# Patient Record
Sex: Female | Born: 1965 | Race: White | Hispanic: No | Marital: Married | Smoking: Former smoker
Health system: Southern US, Community
[De-identification: ages and names within clinical notes are randomized; demographics above are authoritative.]

## PROBLEM LIST (undated history)

## (undated) DIAGNOSIS — I10 Essential (primary) hypertension: Secondary | ICD-10-CM

## (undated) DIAGNOSIS — T7840XA Allergy, unspecified, initial encounter: Secondary | ICD-10-CM

## (undated) DIAGNOSIS — E785 Hyperlipidemia, unspecified: Secondary | ICD-10-CM

## (undated) HISTORY — DX: Allergy, unspecified, initial encounter: T78.40XA

## (undated) HISTORY — DX: Essential (primary) hypertension: I10

## (undated) HISTORY — DX: Hyperlipidemia, unspecified: E78.5

---
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
## 1997-09-20 HISTORY — PX: TUBAL LIGATION: SHX77

## 1997-12-27 ENCOUNTER — Ambulatory Visit (HOSPITAL_COMMUNITY): Admission: RE | Admit: 1997-12-27 | Discharge: 1997-12-27 | Payer: Self-pay | Admitting: *Deleted

## 1998-09-20 HISTORY — PX: ABDOMINAL HYSTERECTOMY: SHX81

## 1998-09-23 ENCOUNTER — Observation Stay (HOSPITAL_COMMUNITY): Admission: RE | Admit: 1998-09-23 | Discharge: 1998-09-24 | Payer: Self-pay | Admitting: Obstetrics and Gynecology

## 2001-01-16 ENCOUNTER — Other Ambulatory Visit: Admission: RE | Admit: 2001-01-16 | Discharge: 2001-01-16 | Payer: Self-pay | Admitting: Obstetrics and Gynecology

## 2001-10-03 ENCOUNTER — Ambulatory Visit (HOSPITAL_BASED_OUTPATIENT_CLINIC_OR_DEPARTMENT_OTHER): Admission: RE | Admit: 2001-10-03 | Discharge: 2001-10-03 | Payer: Self-pay | Admitting: Plastic Surgery

## 2001-10-03 ENCOUNTER — Encounter (INDEPENDENT_AMBULATORY_CARE_PROVIDER_SITE_OTHER): Payer: Self-pay | Admitting: *Deleted

## 2002-09-20 HISTORY — PX: BRAIN SURGERY: SHX531

## 2002-11-14 ENCOUNTER — Other Ambulatory Visit: Admission: RE | Admit: 2002-11-14 | Discharge: 2002-11-14 | Payer: Self-pay | Admitting: Obstetrics and Gynecology

## 2004-02-03 ENCOUNTER — Encounter: Admission: RE | Admit: 2004-02-03 | Discharge: 2004-02-03 | Payer: Self-pay | Admitting: Family Medicine

## 2006-05-12 ENCOUNTER — Other Ambulatory Visit: Admission: RE | Admit: 2006-05-12 | Discharge: 2006-05-12 | Payer: Self-pay | Admitting: Obstetrics and Gynecology

## 2006-11-12 ENCOUNTER — Emergency Department (HOSPITAL_COMMUNITY): Admission: EM | Admit: 2006-11-12 | Discharge: 2006-11-12 | Payer: Self-pay | Admitting: Emergency Medicine

## 2008-05-29 ENCOUNTER — Other Ambulatory Visit: Admission: RE | Admit: 2008-05-29 | Discharge: 2008-05-29 | Payer: Self-pay | Admitting: Family Medicine

## 2009-02-05 ENCOUNTER — Ambulatory Visit (HOSPITAL_COMMUNITY): Admission: RE | Admit: 2009-02-05 | Discharge: 2009-02-05 | Payer: Self-pay | Admitting: Interventional Cardiology

## 2009-07-23 ENCOUNTER — Ambulatory Visit (HOSPITAL_COMMUNITY): Admission: RE | Admit: 2009-07-23 | Discharge: 2009-07-23 | Payer: Self-pay | Admitting: Interventional Cardiology

## 2009-07-23 ENCOUNTER — Encounter (INDEPENDENT_AMBULATORY_CARE_PROVIDER_SITE_OTHER): Payer: Self-pay | Admitting: Interventional Cardiology

## 2010-10-11 ENCOUNTER — Encounter: Payer: Self-pay | Admitting: Obstetrics and Gynecology

## 2011-02-05 NOTE — Op Note (Signed)
[place]. [facility]  Patient:    Natalie Suarez, Natalie Suarez Visit Number: [id number] MRN: [id number]          Service Type: DSU Location: [facility] Attending Physician:  Eloise Levels Dictated by:   Mary A. Contogiannis, M.D. Proc. Date: 10/03/01 Admit Date:  10/03/2001                             Operative Report  PREOPERATIVE DIAGNOSIS:  Bilateral macromastia.  POSTOPERATIVE DIAGNOSIS:  Bilateral macromastia.  PROCEDURE:  Bilateral reduction mammoplasty.  SURGEON:  Mary A. Contogiannis, M.D.  ASSISTANT:  Alethia Berthold, C.F.A.  ANESTHESIA:  General endotracheal.  ANESTHESIOLOGIST:  Janetta Hora. Gelene Mink, M.D.  ESTIMATED BLOOD LOSS:  200 cc.  FLUID REPLACEMENT:  2700 cc crystalloid.  URINE OUTPUT:  150 cc.  COMPLICATIONS:  None.  AMOUNT OF BREAST TISSUE REMOVED:  Right breast, 914 g; left breast, 936 g.  INDICATIONS FOR OVERNIGHT STAY:  Progressive pain control along with ambulation and monitoring of the nipples and breast flaps.  INDICATION FOR PROCEDURE:  The patient is a 45 year old Caucasian female who has bilateral macromastia, neckaches, headaches, backaches, shoulder strap groove marks and pain, along with intertriginous skin rashes, and presents for bilateral reduction mammoplasties at her request.  DESCRIPTION OF PROCEDURE:  The patient was marked in the preop holding area in the pattern of Wise for the future reduction mammoplasties.  She was then taken back to the operating room and placed on the OR table in supine position.  After adequate general endotracheal anesthesia was obtained, the patients breast bases were injected with 1% lidocaine with epinephrine.  The chest was prepped with Betadine and alcohol, then draped in sterile fashion. After adequate hemostasis and anesthesia had taken effect, the procedure was begun.  First the right breast reduction was performed.  The nipple was marked with a 42 mm nipple marker.  This was  then incised and the skin de-epithelialized around an inframammary crease in the inferior pedicle pattern.  Next the medial, superior, and lateral skin flaps were elevated down to the chest wall and slightly off the chest wall to provide better shaping of the breasts.  The excess fat and glandular tissue were removed from the inferior pedicle.  The nipple was then examined and found to be pink and viable.  The wound was irrigated with saline irrigation.  Meticulous hemostasis was obtained with the Bovie electrocautery.  The inferior pedicle was centralized using 3-0 Prolene suture.  A #10 JP flat, fully-fluted drain was then placed into the wound. The skin flaps were brought together at the inverted T junction with a 2-0 Prolene suture.  The incisions were stapled for temporary closure.  Attention was then turned to the left breast.  The nipple was marked with the 42 mm nipple marker.  This was then incised and the skin de-epithelialized around an inframammary crease in the inferior pedicle pattern.  Next the medial, superior, and lateral skin flaps were elevated down to the chest wall and slightly off the chest wall to for of the breasts.  The excess fat and glandular tissue were removed from the inferior pedicle.  The nipple was then examined and found to be pink and viable.  The wound was irrigated with saline irrigation.  Meticulous hemostasis was obtained with the Bovie electrocautery. The inferior pedicle was centralized using 3-0 Prolene suture.  A #10 JP flat, fully-fluted drain was then placed into the wound.  The skin flaps were brought together at the inverted T junction with a 2-0 Prolene suture.  The skin flaps were stapled for temporary closure.  The breasts were then compared and found to have good shape and symmetry.  The staples were then removed and the incisions closed using 3-0 Monocryl interrupted as well as running dermal sutures, followed by 4-0 Monocryl running  intracuticular stitch on the skin.  The patient was then placed in the upright position.  The future location of the nipples was marked with the 38 mm nipple marker.  She was then placed back into the recumbent position.  First the future nipple-areolar complex on the right breast mound was incised.  This tissue was excised full-thickness.  The nipple was then brought out into this aperture and sewn in place using 4-0 Monocryl interrupted dermal sutures followed by 5-0 Monocryl running intracuticular stitch on the skin.  In a likewise fashion, the area for the future nipple-areolar complex was incised on the left breast mound.  The tissue was excised full-thickness.  The nipple was then brought out into this aperture and sewn in place using 4-0 Monocryl interrupted dermal sutures followed by a 5-0 Monocryl running intracuticular stitch on the skin.  The drains were then sewn in place using 3-0 nylon suture.  The incisions were dressed with benzoin and Steri-Strips, additionally with bacitracin ointment and Adaptic on the nipples.  Four by fours were placed over this, and the patient was then placed into a light postoperative support bra.  There were no complications.  The patient tolerated the procedure well.  The final needle and sponge counts were reported to be correct at the end of the case.  She was then awakened from the general endotracheal anesthesia and taken to the recovery room in stable condition.  The patient will be admitted overnight to the [facility] for progressive pain control, ambulation, as well as monitoring of the nipples and breast flaps.  Discharge is planned for the morning.  Amount of breast tissue removed:  Right breast, 914 g; left breast, 936 g. Dictated by:   Mary A. Contogiannis, M.D. Attending Physician:  Eloise Levels DD:  10/03/01 TD:  10/03/01 Job: [phone] [id number]

## 2012-11-10 ENCOUNTER — Other Ambulatory Visit: Payer: Self-pay | Admitting: Family Medicine

## 2012-11-10 DIAGNOSIS — E559 Vitamin D deficiency, unspecified: Secondary | ICD-10-CM

## 2012-11-10 DIAGNOSIS — Z780 Asymptomatic menopausal state: Secondary | ICD-10-CM

## 2012-11-10 DIAGNOSIS — Z1231 Encounter for screening mammogram for malignant neoplasm of breast: Secondary | ICD-10-CM

## 2012-12-01 ENCOUNTER — Other Ambulatory Visit: Payer: Self-pay

## 2012-12-01 ENCOUNTER — Ambulatory Visit: Payer: Self-pay

## 2012-12-26 ENCOUNTER — Ambulatory Visit: Payer: Self-pay

## 2012-12-26 ENCOUNTER — Other Ambulatory Visit: Payer: Self-pay

## 2013-01-19 ENCOUNTER — Ambulatory Visit (INDEPENDENT_AMBULATORY_CARE_PROVIDER_SITE_OTHER): Payer: Commercial Managed Care - PPO | Admitting: General Surgery

## 2013-01-24 ENCOUNTER — Ambulatory Visit (INDEPENDENT_AMBULATORY_CARE_PROVIDER_SITE_OTHER): Payer: Commercial Managed Care - PPO | Admitting: General Surgery

## 2013-02-01 ENCOUNTER — Ambulatory Visit: Payer: Self-pay

## 2013-02-01 ENCOUNTER — Other Ambulatory Visit: Payer: Self-pay

## 2013-02-09 ENCOUNTER — Encounter (INDEPENDENT_AMBULATORY_CARE_PROVIDER_SITE_OTHER): Payer: Self-pay | Admitting: General Surgery

## 2013-02-09 ENCOUNTER — Ambulatory Visit (INDEPENDENT_AMBULATORY_CARE_PROVIDER_SITE_OTHER): Payer: Commercial Managed Care - PPO | Admitting: General Surgery

## 2013-02-09 NOTE — Progress Notes (Signed)
Subjective:   morbid obesity  Patient ID: Natalie Suarez, female   DOB: 08/12/1966, 47 y.o.   MRN: [id number]  HPI Natalie Suarez [id number] y.o.female presents for consideration for surgical treatment for morbid obesity. She works in a Research scientist (medical) at [employer].   she gives a history of progressive obesity since young adulthood, particularly since the birth of her children, despite multiple attempts at medical management. She has been able to lose up to almost 50 pounds at a time with diet and exercise programs but been experiences progressive weight regain. her weight has been affecting her in a number of ways including chronic joint pain and dyslipidemia. She has a strong family history of heart disease as well as diabetes and is very concerned about her long-term health going forward at her current weight.. she has been to our initial information seminar, researched surgical options thoroughly and is interested in lap band surgery due to the safety profile and lack of stapling or permanent rerouting of her intestine. She also feels that slow steady weight loss would fit her better. She has 2 coworkers on whom I operated and of very well with the lap band has gotten a lot of information from them as well as Nurse, mental health.  Past Medical History  Diagnosis Date  . Hyperlipidemia    Past Surgical History  Procedure Laterality Date  . Ear surgery  1980  . Cesarean section  1989  . Abdominal hysterectomy  2000    total  . Brain surgery  2004    reduction  . Tubal ligation  1999   Current Outpatient Prescriptions  Medication Sig Dispense Refill  . Vitamin D, Ergocalciferol, (DRISDOL) 50000 UNITS CAPS Take 50,000 Units by mouth 2 (two) times a week.       No current facility-administered medications for this visit.   Allergies  Allergen Reactions  . Crestor (Rosuvastatin)     Aching muscles  . Lipitor (Atorvastatin)     Aching muscles   History  Substance Use Topics  . Smoking  status: Former Smoker    Quit date: 02/10/1996  . Smokeless tobacco: Not on file  . Alcohol Use: Yes     Comment: rare     Review of Systems  Constitutional: Negative.   Respiratory: Negative.   Cardiovascular: Positive for palpitations. Negative for chest pain and leg swelling.  Gastrointestinal: Negative.        Mild occasional reflux  Genitourinary: Negative.   Musculoskeletal: Positive for myalgias, back pain and arthralgias.  Neurological: Negative.   Hematological: Negative.        Objective:   Physical Exam BP 138/86  Pulse 76  Temp(Src) 97.1 F (36.2 C) (Temporal)  Resp 16  Ht 5\' 2"  (1.575 m)  Wt 227 lb 9.6 oz (103.239 kg)  BMI 41.62 kg/m2  SpO2 97% General: Alert, morbidly obese Caucasian female, in no distress Skin: Warm and dry without rash or infection. HEENT: No palpable masses or thyromegaly. Sclera nonicteric. Pupils equal round and reactive. Oropharynx clear. Lymph nodes: No cervical, supraclavicular, or inguinal nodes palpable. Lungs: Breath sounds clear and equal without increased work of breathing Cardiovascular: Regular rate and rhythm without murmur. No JVD or edema. Peripheral pulses intact. Abdomen: Nondistended. Soft and nontender. No masses palpable. No organomegaly. No palpable hernias. Extremities: No edema or joint swelling or deformity. No chronic venous stasis changes. Neurologic: Alert and fully oriented. Gait normal.    Assessment:     Patient with progressive morbid obesity unresponsive  to multiple efforts at medical management who presents with a BMI of 41.6 and comorbidities of dyslipidemia and chronic joint pain. I believe there would be very significant medical benefit from surgical weight loss. After our discussion of surgical options currently available the patient has decided to proceed with LAP-BAND placement due to the reasons above. We have discussed the nature of morbid obesity and the risk of remaining obese. We discussed the  indications for the procedure, its nature, and expected recovery. The risks of the procedure were discussed in detail including anesthetic complications, bleeding, infection, visceral injury, dysphagia, and long-term risks of mechanical failure, slippage, erosion, esophageal dilatation and failure to lose weight. Rare risk of death was discussed. The patient was given a complete consent form and all questions were answered. We will proceed with preoperative workup including routine lab and x-rays, psychologic and nutrition evaluations, and upper GI series.  I will see the patient back following this evaluation.        Plan:     As above

## 2013-02-13 LAB — LIPID PANEL
HDL: 47 mg/dL (ref >39)
Triglycerides: 139 mg/dL (ref <150)

## 2013-02-13 LAB — CBC WITH DIFFERENTIAL/PLATELET
Eosinophils Absolute: 0.1 10*3/uL (ref 0.0–0.7)
Eosinophils Relative: 2 % (ref 0–5)
Hemoglobin: 13.7 g/dL (ref 12.0–15.0)
Lymphs Abs: 2.7 10*3/uL (ref 0.7–4.0)
MCH: 30.2 pg (ref 26.0–34.0)
MCV: 89.2 fL (ref 78.0–100.0)
Monocytes Absolute: 0.4 10*3/uL (ref 0.1–1.0)
Monocytes Relative: 7 % (ref 3–12)
RBC: 4.53 MIL/uL (ref 3.87–5.11)

## 2013-02-13 LAB — COMPREHENSIVE METABOLIC PANEL
CO2: 27 mEq/L (ref 19–32)
Creat: 0.56 mg/dL (ref 0.50–1.10)
Glucose, Bld: 92 mg/dL (ref 70–99)
Total Bilirubin: 0.7 mg/dL (ref 0.3–1.2)

## 2013-02-13 LAB — T4: T4, Total: 10.6 ug/dL (ref 5.0–12.5)

## 2013-02-13 LAB — TSH: TSH: 1.883 u[IU]/mL (ref 0.350–4.500)

## 2013-02-20 ENCOUNTER — Ambulatory Visit
Admission: RE | Admit: 2013-02-20 | Discharge: 2013-02-20 | Disposition: A | Discharge status: Home / Self Care | Payer: 59 | Source: Ambulatory Visit | Attending: Family Medicine | Admitting: Family Medicine

## 2013-02-20 DIAGNOSIS — E559 Vitamin D deficiency, unspecified: Secondary | ICD-10-CM

## 2013-02-20 DIAGNOSIS — Z780 Asymptomatic menopausal state: Secondary | ICD-10-CM

## 2013-02-20 DIAGNOSIS — Z1231 Encounter for screening mammogram for malignant neoplasm of breast: Secondary | ICD-10-CM

## 2013-02-21 ENCOUNTER — Other Ambulatory Visit: Payer: Self-pay

## 2013-02-21 ENCOUNTER — Ambulatory Visit: Payer: Self-pay

## 2013-03-12 ENCOUNTER — Other Ambulatory Visit (HOSPITAL_COMMUNITY): Payer: Self-pay

## 2013-03-12 ENCOUNTER — Ambulatory Visit (HOSPITAL_COMMUNITY): Payer: Self-pay

## 2013-03-14 ENCOUNTER — Ambulatory Visit: Payer: Self-pay | Admitting: *Deleted

## 2013-03-19 ENCOUNTER — Encounter: Payer: Self-pay | Admitting: *Deleted

## 2014-05-09 ENCOUNTER — Other Ambulatory Visit: Payer: Self-pay

## 2014-12-16 ENCOUNTER — Other Ambulatory Visit: Payer: Self-pay | Admitting: Family Medicine

## 2014-12-16 DIAGNOSIS — Z1231 Encounter for screening mammogram for malignant neoplasm of breast: Secondary | ICD-10-CM

## 2014-12-16 DIAGNOSIS — Z9889 Other specified postprocedural states: Secondary | ICD-10-CM

## 2014-12-17 ENCOUNTER — Ambulatory Visit
Admission: RE | Admit: 2014-12-17 | Discharge: 2014-12-17 | Disposition: A | Discharge status: Home / Self Care | Payer: 59 | Source: Ambulatory Visit | Attending: Family Medicine | Admitting: Family Medicine

## 2014-12-17 DIAGNOSIS — Z9889 Other specified postprocedural states: Secondary | ICD-10-CM

## 2014-12-17 DIAGNOSIS — Z1231 Encounter for screening mammogram for malignant neoplasm of breast: Secondary | ICD-10-CM

## 2015-02-22 ENCOUNTER — Encounter: Payer: Self-pay | Admitting: Dietician

## 2015-02-22 ENCOUNTER — Encounter: Payer: 59 | Attending: General Surgery | Admitting: Dietician

## 2015-02-22 DIAGNOSIS — Z713 Dietary counseling and surveillance: Secondary | ICD-10-CM | POA: Insufficient documentation

## 2015-02-22 DIAGNOSIS — Z6841 Body Mass Index (BMI) 40.0 and over, adult: Secondary | ICD-10-CM | POA: Insufficient documentation

## 2015-02-22 NOTE — Progress Notes (Signed)
  Pre-Op Assessment Visit:  Pre-Operative Sleeve Gastrectomy Surgery  Medical Nutrition Therapy:  Appt start time: 1300   End time:  1340.  Patient was seen on 02/22/2015 for Pre-Operative Nutrition Assessment. Assessment and letter of approval will [facility] coordinator once [facility] from [facility]Hoxworth [facility].   Preferred Learning Style:   No preference indicated   Learning Readiness:   Ready  Handouts given during visit include:  Pre-Op Goals Bariatric Surgery Protein Shakes   During the appointment today the following Pre-Op Goals were reviewed with the patient: Maintain or lose weight as instructed by your surgeon Make healthy food choices Begin to limit portion sizes Limited concentrated sugars and fried foods Keep fat/sugar in the single digits per serving on   food labels Practice CHEWING your food  (aim for 30 chews per bite or until applesauce consistency) Practice not drinking 15 minutes before, during, and 30 minutes after each meal/snack Avoid all carbonated beverages  Avoid/limit caffeinated beverages  Avoid all sugar-sweetened beverages Consume 3 meals per day; eat every 3-5 hours Make a list of non-food related activities Aim for 64-100 ounces of FLUID daily  Aim for at least 60-80 grams of PROTEIN daily Look for a liquid protein source that contain ?15 g protein and ?5 g carbohydrate  (ex: shakes, drinks, shots)  Demonstrated degree of understanding via:  Teach Back  Teaching Method Utilized:  Visual Auditory Hands on  Barriers to learning/adherence to lifestyle change: none  Patient to call the [facility] to enroll in Pre-Op and Post-Op Nutrition Education when surgery date is scheduled.

## 2015-02-22 NOTE — Patient Instructions (Signed)

## 2015-03-07 ENCOUNTER — Other Ambulatory Visit: Payer: Self-pay

## 2015-03-07 NOTE — Addendum Note (Signed)
Addended by: Glenna Fellows T on: 03/07/2015 06:32 PM   Modules accepted: Orders

## 2015-04-03 ENCOUNTER — Other Ambulatory Visit: Payer: Self-pay | Admitting: General Surgery

## 2015-04-16 ENCOUNTER — Ambulatory Visit (HOSPITAL_COMMUNITY): Payer: 59

## 2015-09-16 ENCOUNTER — Ambulatory Visit (INDEPENDENT_AMBULATORY_CARE_PROVIDER_SITE_OTHER): Payer: 59 | Admitting: Emergency Medicine

## 2015-09-16 VITALS — BP 142/88 | HR 89 | Temp 98.1°F | Resp 17 | Ht 62.0 in | Wt 234.0 lb

## 2015-09-16 DIAGNOSIS — J0140 Acute pansinusitis, unspecified: Secondary | ICD-10-CM

## 2015-09-16 DIAGNOSIS — J209 Acute bronchitis, unspecified: Secondary | ICD-10-CM

## 2015-09-16 MED ORDER — PSEUDOEPHEDRINE-GUAIFENESIN ER 60-600 MG PO TB12: 1.0000 | ORAL_TABLET | Freq: Two times a day (BID) | ORAL | Status: AC

## 2015-09-16 MED ORDER — HYDROCOD POLST-CPM POLST ER 10-8 MG/5ML PO SUER
5.0000 mL | Freq: Two times a day (BID) | ORAL | Status: DC
Start: 2015-09-16 — End: 2023-07-26

## 2015-09-16 MED ORDER — AMOXICILLIN-POT CLAVULANATE 875-125 MG PO TABS: 1.0000 | ORAL_TABLET | Freq: Two times a day (BID) | ORAL | Status: DC

## 2015-09-16 NOTE — Progress Notes (Signed)
Subjective:  Patient ID: Natalie Suarez, female    DOB: 1966-06-14  Age: 49 y.o. MRN: [id number]  CC: Headache; Nasal Congestion; and Ear Problem   HPI Loralye O Scholz presents   Patient has nasal congestion postnasal drainage and purulent nasal drainage. She has a cough productive purulent sputum. She has no wheezing or shortness of breath. She has no nausea or vomiting. No fever or chills. No stool change. She's had no improvement with over-the-counter medication  History Lutricia has a past medical history of Hyperlipidemia; Hypertension; and Allergy.   She has past surgical history that includes ear surgery (1980); Cesarean section (1989); Abdominal hysterectomy (2000); Brain surgery (2004); and Tubal ligation (1999).   Her  family history includes Cancer in her maternal uncle and maternal uncle; Diabetes in her mother and sister; Heart disease in her mother and sister; Hyperlipidemia in her mother and sister.  She   reports that she quit smoking about 19 years ago. She does not have any smokeless tobacco history on file. She reports that she drinks alcohol. She reports that she does not use illicit drugs.  Outpatient Prescriptions Prior to Visit  Medication Sig Dispense Refill  . Vitamin D, Ergocalciferol, (DRISDOL) 50000 UNITS CAPS Take 50,000 Units by mouth 2 (two) times a week. Reported on 09/16/2015     No facility-administered medications prior to visit.    Social History   Social History  . Marital Status: Married    Spouse Name: N/A  . Number of Children: N/A  . Years of Education: N/A   Social History Main Topics  . Smoking status: Former Smoker    Quit date: 02/10/1996  . Smokeless tobacco: None  . Alcohol Use: Yes     Comment: rare  . Drug Use: No  . Sexual Activity: Not Asked   Other Topics Concern  . None   Social History Narrative     Review of Systems  Constitutional: Positive for fatigue. Negative for fever, chills and appetite change.    HENT: Positive for congestion, ear pain, postnasal drip, rhinorrhea and sinus pressure. Negative for sore throat.   Eyes: Negative for pain and redness.  Respiratory: Positive for cough. Negative for shortness of breath and wheezing.   Cardiovascular: Negative for leg swelling.  Gastrointestinal: Negative for nausea, vomiting, abdominal pain, diarrhea, constipation and blood in stool.  Endocrine: Negative for polyuria.  Genitourinary: Negative for dysuria, urgency, frequency and flank pain.  Musculoskeletal: Negative for gait problem.  Skin: Negative for rash.  Neurological: Negative for weakness and headaches.  Psychiatric/Behavioral: Negative for confusion and decreased concentration. The patient is not nervous/anxious.     Objective:  BP 142/88 mmHg  Pulse 89  Temp(Src) 98.1 F (36.7 C) (Oral)  Resp 17  Ht  (1.575 m)  Wt 234 lb (106.142 kg)  BMI 42.79 kg/m2  SpO2 96%  Physical Exam  Constitutional: She is oriented to person, place, and time. She appears well-developed and well-nourished. No distress.  HENT:  Head: Normocephalic and atraumatic.  Right Ear: External ear normal.  Left Ear: External ear normal.  Nose: Nose normal.  Eyes: Conjunctivae and EOM are normal. Pupils are equal, round, and reactive to light. No scleral icterus.  Neck: Normal range of motion. Neck supple. No tracheal deviation present.  Cardiovascular: Normal rate, regular rhythm and normal heart sounds.   Pulmonary/Chest: Effort normal. No respiratory distress. She has no wheezes. She has no rales.  Abdominal: She exhibits no mass. There is no tenderness.  There is no rebound and no guarding.  Musculoskeletal: She exhibits no edema.  Lymphadenopathy:    She has no cervical adenopathy.  Neurological: She is alert and oriented to person, place, and time. Coordination normal.  Skin: Skin is warm and dry. No rash noted.  Psychiatric: She has a normal mood and affect. Her behavior is normal.       Assessment & Plan:   [name] was seen today for headache, nasal congestion and ear problem.  Diagnoses and all orders for this visit:  Acute bronchitis, unspecified organism  Acute pansinusitis, recurrence not specified  Other orders -     amoxicillin-clavulanate (AUGMENTIN) 875-125 MG tablet; Take 1 tablet by mouth 2 (two) times daily. -     pseudoephedrine-guaifenesin (MUCINEX D) 60-600 MG 12 hr tablet; Take 1 tablet by mouth every 12 (twelve) hours. -     chlorpheniramine-HYDROcodone (TUSSIONEX PENNKINETIC ER) 10-8 MG/5ML SUER; Take 5 mLs by mouth 2 (two) times daily.  I am having Ms. Cooperwood start on amoxicillin-clavulanate, pseudoephedrine-guaifenesin, and chlorpheniramine-HYDROcodone. I am also having her maintain her Vitamin D (Ergocalciferol).  Meds ordered this encounter  Medications  . amoxicillin-clavulanate (AUGMENTIN) 875-125 MG tablet    Sig: Take 1 tablet by mouth 2 (two) times daily.    Dispense:  20 tablet    Refill:  0  . pseudoephedrine-guaifenesin (MUCINEX D) 60-600 MG 12 hr tablet    Sig: Take 1 tablet by mouth every 12 (twelve) hours.    Dispense:  18 tablet    Refill:  0  . chlorpheniramine-HYDROcodone (TUSSIONEX PENNKINETIC ER) 10-8 MG/5ML SUER    Sig: Take 5 mLs by mouth 2 (two) times daily.    Dispense:  60 mL    Refill:  0    Appropriate red flag conditions were discussed with the patient as well as actions that should be taken.  Patient expressed his understanding.  Follow-up: Return if symptoms worsen or fail to improve.  Carmelina DaneAnderson, Rudi Knippenberg S, MD

## 2015-09-16 NOTE — Patient Instructions (Signed)

## 2015-12-23 DIAGNOSIS — R05 Cough: Secondary | ICD-10-CM | POA: Diagnosis not present

## 2016-02-04 DIAGNOSIS — H5203 Hypermetropia, bilateral: Secondary | ICD-10-CM | POA: Diagnosis not present

## 2016-02-04 DIAGNOSIS — H524 Presbyopia: Secondary | ICD-10-CM | POA: Diagnosis not present

## 2016-05-17 DIAGNOSIS — N898 Other specified noninflammatory disorders of vagina: Secondary | ICD-10-CM | POA: Diagnosis not present

## 2016-05-17 DIAGNOSIS — Z0000 Encounter for general adult medical examination without abnormal findings: Secondary | ICD-10-CM | POA: Diagnosis not present

## 2016-05-17 DIAGNOSIS — R8290 Unspecified abnormal findings in urine: Secondary | ICD-10-CM | POA: Diagnosis not present

## 2016-05-20 DIAGNOSIS — E559 Vitamin D deficiency, unspecified: Secondary | ICD-10-CM | POA: Diagnosis not present

## 2016-05-20 DIAGNOSIS — E785 Hyperlipidemia, unspecified: Secondary | ICD-10-CM | POA: Diagnosis not present

## 2016-05-20 DIAGNOSIS — R7301 Impaired fasting glucose: Secondary | ICD-10-CM | POA: Diagnosis not present

## 2016-05-25 MED FILL — VIT D2 1.25 MG (50,000 UNIT: 1.25 MG | 84 days supply | Qty: 24 | Fill #0

## 2016-05-31 MED FILL — CIPROFLOXACIN HCL 500 MG TA: 500 | 7 days supply | Qty: 14 | Fill #0

## 2016-05-31 MED FILL — FLUCONAZOLE 150 MG TABLET: 150 | 1 days supply | Qty: 1 | Fill #0

## 2016-10-19 DIAGNOSIS — R6889 Other general symptoms and signs: Secondary | ICD-10-CM | POA: Diagnosis not present

## 2016-10-19 DIAGNOSIS — A084 Viral intestinal infection, unspecified: Secondary | ICD-10-CM | POA: Diagnosis not present

## 2016-10-19 MED FILL — ONDANSETRON HCL 4 MG TABLET: 4 | 7 days supply | Qty: 15 | Fill #0

## 2017-03-11 DIAGNOSIS — M722 Plantar fascial fibromatosis: Secondary | ICD-10-CM | POA: Diagnosis not present

## 2017-03-21 MED FILL — OXYCODONE-ACETAMINOPHEN 5-3: 5-325 | 3 days supply | Qty: 12 | Fill #0

## 2017-03-21 MED FILL — IBUPROFEN 800 MG TAB: 800 | 7 days supply | Qty: 21 | Fill #0

## 2017-03-21 MED FILL — AMOXICILLIN 500 MG CAPSULE: 500 | 7 days supply | Qty: 21 | Fill #0

## 2017-04-04 ENCOUNTER — Ambulatory Visit: Payer: 59 | Admitting: Podiatry

## 2017-04-04 DIAGNOSIS — H2513 Age-related nuclear cataract, bilateral: Secondary | ICD-10-CM | POA: Diagnosis not present

## 2017-04-04 DIAGNOSIS — H524 Presbyopia: Secondary | ICD-10-CM | POA: Diagnosis not present

## 2017-04-04 DIAGNOSIS — H52201 Unspecified astigmatism, right eye: Secondary | ICD-10-CM | POA: Diagnosis not present

## 2017-04-04 DIAGNOSIS — H5201 Hypermetropia, right eye: Secondary | ICD-10-CM | POA: Diagnosis not present

## 2017-04-04 DIAGNOSIS — H25043 Posterior subcapsular polar age-related cataract, bilateral: Secondary | ICD-10-CM | POA: Diagnosis not present

## 2017-04-21 ENCOUNTER — Encounter: Payer: 59 | Admitting: Podiatry

## 2017-04-21 ENCOUNTER — Telehealth: Payer: Self-pay | Admitting: Podiatry

## 2017-04-21 NOTE — Telephone Encounter (Signed)
Left vm for pt to call to schedule appt

## 2017-04-22 NOTE — Progress Notes (Signed)
Patient cancelled

## 2017-05-16 ENCOUNTER — Ambulatory Visit: Payer: 59 | Admitting: Podiatry

## 2017-05-27 MED FILL — AZITHROMYCIN 250 MG TAB: 250 | 5 days supply | Qty: 6 | Fill #0

## 2017-09-05 DIAGNOSIS — H2513 Age-related nuclear cataract, bilateral: Secondary | ICD-10-CM | POA: Diagnosis not present

## 2017-09-05 DIAGNOSIS — H25043 Posterior subcapsular polar age-related cataract, bilateral: Secondary | ICD-10-CM | POA: Diagnosis not present

## 2017-09-05 MED FILL — OFLOXACIN 0.3% EYE DROPS: 0.3 | 50 days supply | Qty: 5 | Fill #0

## 2017-09-05 MED FILL — DUREZOL 0.05% EYE DROPS: 0.05 | 50 days supply | Qty: 5 | Fill #0

## 2017-09-14 DIAGNOSIS — H2511 Age-related nuclear cataract, right eye: Secondary | ICD-10-CM | POA: Diagnosis not present

## 2017-09-14 DIAGNOSIS — H25011 Cortical age-related cataract, right eye: Secondary | ICD-10-CM | POA: Diagnosis not present

## 2017-11-02 MED FILL — CHLORHEXIDINE 0.12% RINSE: 0.12 | 16 days supply | Qty: 473 | Fill #0

## 2018-03-16 MED FILL — SULFAMETHOXAZOLE-TMP DS TAB: 800-160 | 5 days supply | Qty: 10 | Fill #0

## 2018-04-11 MED FILL — NITROFURANTOIN MONO-MCR 100: 100 | 7 days supply | Qty: 14 | Fill #0

## 2018-04-11 MED FILL — VIT D2 1.25 MG (50,000 UNIT: 1.25 MG | 84 days supply | Qty: 12 | Fill #0

## 2018-04-11 MED FILL — PRAVASTATIN NA 40 MG TAB: 40 | 90 days supply | Qty: 90 | Fill #0

## 2018-05-01 MED FILL — CEFUROXIME AXETIL 250 MG TA: 250 | 7 days supply | Qty: 14 | Fill #0

## 2018-06-09 MED FILL — SULFAMETHOXAZOLE-TMP DS TAB: 800-160 | 7 days supply | Qty: 14 | Fill #0

## 2018-06-15 MED FILL — NITROFURANTOIN MONO-MCR 100: 100 | 7 days supply | Qty: 14 | Fill #0

## 2018-06-30 MED FILL — NITROFURANTOIN MONO-MCR 100: 100 | 10 days supply | Qty: 20 | Fill #0

## 2018-07-26 MED FILL — CEFUROXIME AXETIL 250 MG TA: 250 | 7 days supply | Qty: 14 | Fill #0

## 2018-07-26 MED FILL — PHENAZOPYRIDINE 200 MG TAB: 200 | 2 days supply | Qty: 6 | Fill #0

## 2018-09-07 MED FILL — CIPROFLOXACIN HCL 500 MG TA: 500 | 3 days supply | Qty: 6 | Fill #0

## 2018-09-08 MED FILL — PRAVASTATIN NA 40 MG TAB: 40 | 90 days supply | Qty: 90 | Fill #0

## 2018-10-02 MED FILL — MELOXICAM 7.5 MG TABLET: 7.5 | 10 days supply | Qty: 20 | Fill #0

## 2018-10-05 MED FILL — CIPROFLOXACIN HCL 500 MG TA: 500 | 30 days supply | Qty: 60 | Fill #0

## 2018-10-10 MED FILL — MELOXICAM 7.5 MG TABLET: 7.5 | 15 days supply | Qty: 30 | Fill #0

## 2018-12-02 MED FILL — predniSONE 10 MG TABS: 10 | 12 days supply | Qty: 21 | Fill #0

## 2018-12-20 MED FILL — metFORMIN HCL ER 500 MG TB2: 500 | 30 days supply | Qty: 60 | Fill #0

## 2019-01-19 MED FILL — ADVAIR 250/50 DISKUS: 250-50 | 30 days supply | Qty: 60 | Fill #0

## 2019-01-19 MED FILL — PRAVASTATIN NA 40 MG TAB: 40 | 90 days supply | Qty: 90 | Fill #0

## 2019-01-19 MED FILL — MONTELUKAST SOD 10 MG TAB: 10 | 30 days supply | Qty: 30 | Fill #0

## 2019-01-31 MED FILL — metFORMIN HCL ER 500 MG TB2: 500 | 30 days supply | Qty: 60 | Fill #1

## 2019-03-02 MED FILL — metFORMIN HCL ER 500 MG TB2: 500 | 45 days supply | Qty: 180 | Fill #0

## 2019-03-29 MED FILL — NITROFURANTOIN MONO-MCR 100: 100 | 7 days supply | Qty: 14 | Fill #0

## 2019-05-02 MED FILL — PRAVASTATIN NA 40 MG TAB: 40 | 90 days supply | Qty: 90 | Fill #0

## 2019-05-12 MED FILL — metFORMIN HCL ER 500 MG TB2: 500 | 90 days supply | Qty: 360 | Fill #0

## 2019-06-13 MED FILL — NITROFURANTOIN MONO-MCR 100: 100 | 7 days supply | Qty: 14 | Fill #0

## 2019-06-18 MED FILL — PRAVASTATIN NA 40 MG TAB: 40 | 90 days supply | Qty: 90 | Fill #0

## 2019-07-02 MED FILL — NITROFURANTOIN MONO-MCR 100: 100 | 30 days supply | Qty: 30 | Fill #0

## 2019-07-04 MED FILL — SULFAMETHOXAZOLE-TMP DS TAB: 800-160 | 14 days supply | Qty: 28 | Fill #0

## 2019-07-23 ENCOUNTER — Encounter (HOSPITAL_COMMUNITY): Payer: Self-pay | Admitting: Emergency Medicine

## 2019-07-23 ENCOUNTER — Other Ambulatory Visit: Payer: Self-pay

## 2019-07-23 ENCOUNTER — Emergency Department (HOSPITAL_COMMUNITY): Payer: No Typology Code available for payment source

## 2019-07-23 ENCOUNTER — Emergency Department (HOSPITAL_COMMUNITY)
Admission: EM | Admit: 2019-07-23 | Discharge: 2019-07-23 | Disposition: A | Discharge status: Home / Self Care | Payer: No Typology Code available for payment source | Attending: Emergency Medicine | Admitting: Emergency Medicine

## 2019-07-23 DIAGNOSIS — Z79899 Other long term (current) drug therapy: Secondary | ICD-10-CM | POA: Insufficient documentation

## 2019-07-23 DIAGNOSIS — I10 Essential (primary) hypertension: Secondary | ICD-10-CM | POA: Insufficient documentation

## 2019-07-23 DIAGNOSIS — R002 Palpitations: Secondary | ICD-10-CM | POA: Diagnosis not present

## 2019-07-23 DIAGNOSIS — Z87891 Personal history of nicotine dependence: Secondary | ICD-10-CM | POA: Insufficient documentation

## 2019-07-23 DIAGNOSIS — R000 Tachycardia, unspecified: Secondary | ICD-10-CM | POA: Diagnosis not present

## 2019-07-23 LAB — PHOSPHORUS: Phosphorus: 3.6 mg/dL (ref 2.5–4.6)

## 2019-07-23 LAB — HEPATIC FUNCTION PANEL
ALT: 39 U/L (ref 0–44)
AST: 31 U/L (ref 15–41)
Albumin: 4.3 g/dL (ref 3.5–5.0)
Alkaline Phosphatase: 56 U/L (ref 38–126)
Bilirubin, Direct: 0.1 mg/dL (ref 0.0–0.2)
Indirect Bilirubin: 0.8 mg/dL (ref 0.3–0.9)
Total Bilirubin: 0.9 mg/dL (ref 0.3–1.2)
Total Protein: 7.3 g/dL (ref 6.5–8.1)

## 2019-07-23 LAB — BASIC METABOLIC PANEL
Anion gap: 13 (ref 5–15)
BUN: 14 mg/dL (ref 6–20)
CO2: 23 mmol/L (ref 22–32)
Calcium: 9.4 mg/dL (ref 8.9–10.3)
Chloride: 103 mmol/L (ref 98–111)
Creatinine, Ser: 0.94 mg/dL (ref 0.44–1.00)
GFR calc Af Amer: >60 mL/min (ref >60)
GFR calc non Af Amer: >60 mL/min (ref >60)
Glucose, Bld: 157 mg/dL — ABNORMAL HIGH (ref 70–99)
Potassium: 4.5 mmol/L (ref 3.5–5.1)
Sodium: 139 mmol/L (ref 135–145)

## 2019-07-23 LAB — I-STAT BETA HCG BLOOD, ED (MC, WL, AP ONLY): I-stat hCG, quantitative: <5 m[IU]/mL (ref <5)

## 2019-07-23 LAB — TROPONIN I (HIGH SENSITIVITY)
Troponin I (High Sensitivity): 6 ng/L (ref <18)
Troponin I (High Sensitivity): 6 ng/L (ref <18)

## 2019-07-23 LAB — CBC
HCT: 45.8 % (ref 36.0–46.0)
Hemoglobin: 14.9 g/dL (ref 12.0–15.0)
MCH: 30.9 pg (ref 26.0–34.0)
MCHC: 32.5 g/dL (ref 30.0–36.0)
MCV: 95 fL (ref 80.0–100.0)
Platelets: 248 10*3/uL (ref 150–400)
RBC: 4.82 MIL/uL (ref 3.87–5.11)
RDW: 11.7 % (ref 11.5–15.5)
WBC: 7.6 10*3/uL (ref 4.0–10.5)
nRBC: 0 % (ref 0.0–0.2)

## 2019-07-23 LAB — T4, FREE: Free T4: 0.86 ng/dL (ref 0.61–1.12)

## 2019-07-23 LAB — MAGNESIUM: Magnesium: 1.9 mg/dL (ref 1.7–2.4)

## 2019-07-23 LAB — TSH: TSH: 2.35 u[IU]/mL (ref 0.350–4.500)

## 2019-07-23 LAB — D-DIMER, QUANTITATIVE: D-Dimer, Quant: <0.27 ug/mL-FEU (ref 0.00–0.50)

## 2019-07-23 MED ORDER — CARVEDILOL 12.5 MG PO TABS
6.2500 mg | ORAL_TABLET | Freq: Two times a day (BID) | ORAL | Status: DC
  Administered 2019-07-23: 16:00:00 6.25 mg via ORAL
  Filled 2019-07-23: qty 1

## 2019-07-23 MED ORDER — SODIUM CHLORIDE 0.9% FLUSH: 3.0000 mL | Freq: Once | INTRAVENOUS | Status: DC

## 2019-07-23 MED ORDER — METOPROLOL TARTRATE 5 MG/5ML IV SOLN
2.5000 mg | Freq: Once | INTRAVENOUS | Status: AC
  Administered 2019-07-23: 2.5 mg via INTRAVENOUS
  Filled 2019-07-23: qty 5

## 2019-07-23 MED ORDER — CARVEDILOL 6.25 MG PO TABS: 6.2500 mg | ORAL_TABLET | Freq: Two times a day (BID) | ORAL | 1 refills | Status: DC

## 2019-07-23 MED FILL — CARVEDILOL 6.25 MG TABLET: 6.25 | 30 days supply | Qty: 60 | Fill #0

## 2019-07-23 NOTE — Discharge Instructions (Signed)
1.  Start taking carvedilol 6.25 mg twice daily.  Monitor your heart rate and blood pressure and keep a journal. 2.  Return to the emergency department if you are developing chest pain, lightheadedness, feel he will pass out or short of breath or other concerning symptoms. 3.  Call your doctor tomorrow to schedule a follow-up appointment as soon as possible.  You will need evaluation for further testing such as stress test and ambulatory heart monitoring.

## 2019-07-23 NOTE — ED Notes (Signed)
Patient verbalizes understanding of discharge instructions. Opportunity for questioning and answers were provided. Armband removed by staff, pt discharged from ED ambulatory to home.  

## 2019-07-23 NOTE — ED Provider Notes (Addendum)
[facility] EMERGENCY DEPARTMENT Provider Note   CSN: [id number] Arrival date & time: 07/23/19  1042     History   Chief Complaint Chief Complaint  Patient presents with  . Hypertension  . Palpitations    HPI Natalie Suarez is a 53 y.o. female.     HPI Patient reports that she has had intermittent episodes of heart racing and occasionally getting short of breath and diaphoretic with symptoms.  Episodes were not prolonged.  This has been occurring over about the past week.  She reports last week she had an episode where her heart seemed to be beating quite fast.  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  She reports that day her heart was racing and she felt short of breath and fatigue to the point that she had to stop to rest a couple of times.  This is atypical.  Symptoms recurred today with more prolonged episode of heart racing and patient becoming diaphoretic.  She denies she is experiencing actual chest pain with these episodes.  She does sometimes feel extremely fatigued afterwards and shortness of breath can be a problem.  Patient denies she has been sick.  She has not had fever chills or productive cough.  Non-smoker.  Patient denies she has ever been told she should take medication for hypertension.  She reports last week her blood pressures were somewhat elevated but she was not told to start medicine.  Family history is for a mother who died in her early 75s from heart attack. Sister died in mid 35s from CVA.    Past Medical History:  Diagnosis Date  . Allergy   . Hyperlipidemia   . Hypertension     Patient Active Problem List   Diagnosis Date Noted  . Morbid obesity (HCC) 02/09/2013    Past Surgical History:  Procedure Laterality Date  . ABDOMINAL HYSTERECTOMY  2000   total  . BRAIN SURGERY  2004   reduction  . CESAREAN SECTION  1989  . ear surgery  1980  . TUBAL LIGATION  1999     OB History   No  obstetric history on file.      Home Medications    Prior to Admission medications   Medication Sig Start Date End Date Taking? Authorizing Provider  cefUROXime (CEFTIN) 250 MG tablet Take 250 mg by mouth 2 (two) times daily.   Yes [provider]  cholecalciferol (VITAMIN D3) 25 MCG (1000 UT) tablet Take 1,000 Units by mouth daily.   Yes [provider]  metFORMIN (GLUCOPHAGE-XR) 500 MG 24 hr tablet Take 1,000 mg by mouth 2 (two) times daily. 05/12/19  Yes [provider]  pravastatin (PRAVACHOL) 40 MG tablet Take 40 mg by mouth daily. 05/02/19  Yes [provider]  amoxicillin-clavulanate (AUGMENTIN) 875-125 MG tablet Take 1 tablet by mouth 2 (two) times daily. Patient not taking: Reported on 07/23/2019 09/16/15   Carmelina Dane, MD  carvedilol (COREG) 6.25 MG tablet Take 1 tablet (6.25 mg total) by mouth 2 (two) times daily with a meal. 07/23/19   Arby Barrette, MD  chlorpheniramine-HYDROcodone (TUSSIONEX PENNKINETIC ER) 10-8 MG/5ML SUER Take 5 mLs by mouth 2 (two) times daily. Patient not taking: Reported on 07/23/2019 09/16/15   Carmelina Dane, MD    Family History Family History  Problem Relation Age of Onset  . Heart disease Mother   . Diabetes Mother   . Hyperlipidemia Mother   .  Heart disease Sister   . Hyperlipidemia Sister   . Diabetes Sister   . Cancer Maternal Uncle        lung  . Cancer Maternal Uncle        liver,lymph nodes    Social History Social History   Tobacco Use  . Smoking status: Former Smoker    Quit date: 02/10/1996    Years since quitting: 23.4  . Smokeless tobacco: Never Used  Substance Use Topics  . Alcohol use: Yes    Comment: rare  . Drug use: No     Allergies   Crestor [rosuvastatin] and Lipitor [atorvastatin]   Review of Systems Review of Systems 10 Systems reviewed and are negative for acute change except as noted in the HPI.   Physical Exam Updated Vital Signs BP 124/80   Pulse  82   Temp 98.6 F (37 C) (Oral)   Resp (!) 21   Ht 5\' 2"  (1.575 m)   Wt 99.8 kg   SpO2 95%   BMI 40.24 kg/m   Physical Exam Constitutional:      Comments: Alert and appropriate.  No respiratory distress.  Nontoxic.  Monitor shows sinus rhythm high 90s to 100.  HENT:     Head: Normocephalic and atraumatic.     Mouth/Throat:     Pharynx: Oropharynx is clear.  Eyes:     Extraocular Movements: Extraocular movements intact.     Conjunctiva/sclera: Conjunctivae normal.  Neck:     Musculoskeletal: Normal range of motion and neck supple.     Comments: No thyromegaly. Cardiovascular:     Comments: Heart is regular.  Borderline tachycardia.  No rub murmur gallop. Pulmonary:     Effort: Pulmonary effort is normal.     Breath sounds: Normal breath sounds.  Abdominal:     General: Abdomen is flat. There is no distension.     Tenderness: There is no abdominal tenderness. There is no guarding.  Musculoskeletal: Normal range of motion.        General: No swelling or tenderness.     Right lower leg: No edema.     Left lower leg: No edema.  Skin:    General: Skin is warm and dry.  Neurological:     General: No focal deficit present.     Mental Status: She is oriented to person, place, and time.     Coordination: Coordination normal.  Psychiatric:        Mood and Affect: Mood normal.      ED Treatments / Results  Labs (all labs ordered are listed, but only abnormal results are displayed) Labs Reviewed  BASIC METABOLIC PANEL - Abnormal; Notable for the following components:      Result Value   Glucose, Bld 157 (*)    All other components within normal limits  CBC  T4, FREE  MAGNESIUM  PHOSPHORUS  HEPATIC FUNCTION PANEL  D-DIMER, QUANTITATIVE (NOT AT [facility])  TSH  I-STAT BETA HCG BLOOD, ED ([facility], [facility], [facility] ONLY)  TROPONIN I (HIGH SENSITIVITY)  TROPONIN I (HIGH SENSITIVITY)    EKG EKG Interpretation  Date/Time:  Monday July 23 2019 11:10:15 EST Ventricular Rate:  109 PR  Interval:  142 QRS Duration: 78 QT Interval:  334 QTC Calculation: 449 R Axis:   75 Text Interpretation: Sinus tachycardia Right atrial enlargement Borderline ECG agree. no old comparison Confirmed by Charlesetta Shanks [phone]) on 07/23/2019 1:16:31 PM   Radiology Dg Chest 2 View  Result Date: 07/23/2019 CLINICAL DATA:  Chest pain with shortness of breath and cardiac palpitations EXAM: CHEST - 2 VIEW COMPARISON:  Chest CT Feb 03, 2004 FINDINGS: Lungs are clear. Heart size and pulmonary vascularity are normal. No adenopathy. No bone lesions. No pneumothorax. IMPRESSION: No edema or consolidation. Electronically Signed   By: Bretta BangWilliam  Woodruff III M.D.   On: 07/23/2019 11:36    Procedures Procedures (including critical care time)  Medications Ordered in ED Medications  sodium chloride flush (NS) 0.9 % injection 3 mL (3 mLs Intravenous Not Given 07/23/19 1123)  carvedilol (COREG) tablet 6.25 mg (has no administration in time range)  metoprolol tartrate (LOPRESSOR) injection 2.5 mg (2.5 mg Intravenous Given 07/23/19 1413)     Initial Impression / Assessment and Plan / ED Course  I have reviewed the triage vital signs and the nursing notes.  Pertinent labs & imaging results that were available during my care of the patient were reviewed by me and considered in my medical decision making (see chart for details).       Patient has been getting episodes of palpitations with the quality of heart racing but not skipping or missing beats.  At times this is limiting her ability for exertion due to dyspnea and fatigue.  Symptoms have been self-limited.  She has had a couple episodes of more prolonged symptoms.  That includes today with associated diaphoresis.  Patient has significant family history for early vascular disease.  First troponin is negative.  Will add thyroid studies.  Patient is borderline hypertensive with heart rate sinus rhythm but in the mid 90s to 100.  Will give low-dose Lopressor  empirically.  Patient has responded well to Lopressor.  Blood pressures are mid 120s over 80s.  Heart rate is in the 80s in sinus rhythm.  Thyroid studies, D-dimer are normal.  At this time, I do feel patient is stable for discharge initiating low-dose carvedilol and close follow-up with her PCP and cardiology.  Patient is counseled to keep a journal of her blood pressures and heart rates.  Return precautions reviewed.  Final Clinical Impressions(s) / ED Diagnoses   Final diagnoses:  Palpitations  Regular sinus tachycardia    ED Discharge Orders         Ordered    carvedilol (COREG) 6.25 MG tablet  2 times daily with meals     07/23/19 1611           Arby BarrettePfeiffer, Luva Metzger, MD 07/23/19 1350    Arby BarrettePfeiffer, Maddi Collar, MD 07/23/19 1615

## 2019-07-23 NOTE — ED Triage Notes (Signed)
C/o shortness of breath and palpitations started am while at work- "was feeling a little anxious, but I don't have panic attacks" also states that she had an episode of same last Thursday.

## 2019-07-31 MED FILL — NITROFURANTOIN MONO-MCR 100: 100 | 30 days supply | Qty: 30 | Fill #1

## 2019-08-03 MED FILL — LISINOPRIL 10 MG TABS: 10 | 30 days supply | Qty: 30 | Fill #0

## 2019-08-03 MED FILL — FREESTYLE LITE METER: 30 days supply | Qty: 1 | Fill #0

## 2019-08-03 MED FILL — FREESTYLE LANCETS: 90 days supply | Qty: 100 | Fill #0

## 2019-08-03 MED FILL — FREESTYLE LITE TEST STRIP: 90 days supply | Qty: 100 | Fill #0

## 2019-08-15 MED FILL — FREESTYLE LANCETS: 90 days supply | Qty: 100 | Fill #0

## 2019-08-15 MED FILL — FREESTYLE LITE METER: 30 days supply | Qty: 1 | Fill #0

## 2019-08-15 MED FILL — FREESTYLE LITE TEST STRIP: 90 days supply | Qty: 100 | Fill #0

## 2019-08-24 MED FILL — PEG-3350 SOLUTION: 420 | 1 days supply | Qty: 4000 | Fill #0

## 2019-08-28 MED FILL — CARVEDILOL 6.25 MG TABLET: 6.25 | 30 days supply | Qty: 60 | Fill #1

## 2019-09-03 MED FILL — METFORMIN HCL ER 500 MG TB2: 500 | 45 days supply | Qty: 180 | Fill #0

## 2019-09-17 MED FILL — NITROFURANTOIN MONO-MCR 100: 100 | 30 days supply | Qty: 30 | Fill #2

## 2019-09-27 MED FILL — predniSONE 10 MG TABS: 10 | 12 days supply | Qty: 21 | Fill #0

## 2019-10-09 MED FILL — CARVEDILOL 6.25 MG TABLET: 6.25 | 90 days supply | Qty: 180 | Fill #0

## 2019-10-09 MED FILL — PRAVASTATIN NA 40 MG TAB: 40 | 90 days supply | Qty: 90 | Fill #0

## 2019-10-19 MED FILL — NITROFURANTOIN MONO-MCR 100: 100 | 30 days supply | Qty: 30 | Fill #3

## 2019-10-22 MED FILL — METFORMIN HCL ER 500 MG TB2: 500 | 30 days supply | Qty: 120 | Fill #0

## 2019-11-20 MED FILL — DICLOFENAC SOD EC 75 MG TAB: 75 | 15 days supply | Qty: 30 | Fill #0

## 2019-11-28 MED FILL — NITROFURANTOIN MONO-MCR 100: 100 | 30 days supply | Qty: 30 | Fill #4

## 2019-11-28 MED FILL — METFORMIN HCL ER 500 MG TB2: 500 | 15 days supply | Qty: 60 | Fill #1

## 2019-12-17 MED FILL — METFORMIN HCL ER 500 MG TB2: 500 | 30 days supply | Qty: 120 | Fill #0

## 2020-01-01 MED FILL — FREESTYLE LITE TEST STRIP: 90 days supply | Qty: 100 | Fill #1

## 2020-01-01 MED FILL — NITROFURANTOIN MONO-MCR 100: 100 | 30 days supply | Qty: 30 | Fill #5

## 2020-01-17 MED FILL — PRAVASTATIN NA 40 MG TAB: 40 | 90 days supply | Qty: 90 | Fill #1

## 2020-01-21 MED FILL — METFORMIN HCL ER 500 MG TB2: 500 | 30 days supply | Qty: 120 | Fill #0

## 2020-01-21 MED FILL — CARVEDILOL 6.25 MG TABLET: 6.25 | 90 days supply | Qty: 180 | Fill #1

## 2020-02-04 MED FILL — LISINOPRIL 20 MG TABLET: 20 | 90 days supply | Qty: 90 | Fill #0

## 2020-02-14 ENCOUNTER — Other Ambulatory Visit: Payer: Self-pay

## 2020-02-14 ENCOUNTER — Ambulatory Visit: Payer: No Typology Code available for payment source | Attending: Physician Assistant

## 2020-02-14 DIAGNOSIS — M25551 Pain in right hip: Secondary | ICD-10-CM | POA: Diagnosis present

## 2020-02-14 DIAGNOSIS — G8929 Other chronic pain: Secondary | ICD-10-CM | POA: Insufficient documentation

## 2020-02-14 DIAGNOSIS — M25552 Pain in left hip: Secondary | ICD-10-CM | POA: Insufficient documentation

## 2020-02-14 DIAGNOSIS — M545 Low back pain: Secondary | ICD-10-CM | POA: Insufficient documentation

## 2020-02-18 NOTE — Therapy (Addendum)
[place], [place], [address] Phone: [phone]   Fax:  [phone]  Physical Therapy Evaluation/DCSummary  Patient Details  Name: Natalie Suarez MRN: [id number] Date of Birth: December 26, 1965 Referring Provider (PT): Jenny Reichmann, [place]   Encounter Date: 02/14/2020  PT End of Session - 02/18/20 0929    Visit Number  1    Number of Visits  7    Date for PT Re-Evaluation  04/07/20    Authorization Type  [facility]    Progress Note Due on Visit  7    PT Start Time  1621    PT Stop Time  1709    PT Time Calculation (min)  48 min    Activity Tolerance  Patient tolerated treatment well    Behavior During Therapy  [facility] for tasks assessed/performed       Past Medical History:  Diagnosis Date  . Allergy   . Hyperlipidemia   . Hypertension     Past Surgical History:  Procedure Laterality Date  . ABDOMINAL HYSTERECTOMY  2000   total  . BRAIN SURGERY  2004   reduction  . CESAREAN SECTION  1989  . ear surgery  1980  . TUBAL LIGATION  1999    There were no vitals filed for this visit.   Subjective Assessment - 02/18/20 0811    Subjective  Pt reports a 4 month Hx of low back pain which varies from day to day with a pain range of 2-10/10. today she rates her pain a 2/10, while last Sunday it was a 7/10 after lying down. She reports getting up and walking was helpful. With her job, which involves lifting, bending, reaching, pulling and twisting her pain level will vary. Pt states she has been told she has a R pinched nerve, L sciatica, and scoliosis.    How long can you sit comfortably?  1 hour, varies    How long can you stand comfortably?  1 hour, varies    How long can you walk comfortably?  1 hour, varies    Diagnostic tests  Not available    Patient Stated Goals  To have les pain and to tolerate work better.    Currently in Pain?  Yes    Pain Score  2     Pain Location  Back    Pain  Orientation  Posterior;Lower;Right   R flank   Pain Descriptors / Indicators  Tingling;Burning    Pain Type  Chronic pain    Pain Radiating Towards  R flank    Pain Onset  More than a month ago    Pain Frequency  Constant    Aggravating Factors   Not consistent    Pain Relieving Factors  Not consistent    Effect of Pain on Daily Activities  pt states when she is hurting at work, she pushes through the pain.         [facility] PT Assessment - 02/18/20 0001      Assessment   Medical Diagnosis  Pain in left hip, Other chronic pain, Lumbago with sciatica, left side, Pain in right hip    Referring Provider (PT)  Cheryle Horsfall, Marva Panda, PA-C    Onset Date/Surgical Date  --   4 months ago   Hand Dominance  Right    Prior Therapy  None      Precautions   Precautions  None      Restrictions   Weight  Bearing Restrictions  No      Balance Screen   Has the patient fallen in the past 6 months  No    Has the patient had a decrease in activity level because of a fear of falling?   No    Is the patient reluctant to leave their home because of a fear of falling?   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, bending, pulling, twisting      Cognition   Overall Cognitive Status  Within Functional Limits for tasks assessed      Observation/Other Assessments   Focus on Therapeutic Outcomes (FOTO)   NA      Sensation   Light Touch  Appears Intact      Coordination   Gross Motor Movements are Fluid and Coordinated  Yes      Posture/Postural Control   Posture/Postural Control  Postural  limitations    Postural Limitations  Rounded Shoulders;Increased lumbar lordosis      ROM / Strength   AROM / PROM / Strength  AROM;Strength      AROM   AROM Assessment Site  Lumbar    Lumbar Flexion  WNLs   Lodosis not completely reversed with foward flexion   Lumbar Extension  WNLS    Lumbar - Right Side Bend  WNLS    Lumbar - Left Side Bend  WNLs    Lumbar - Right Rotation  WNLs    Lumbar - Left Rotation  WNLs      Strength   Overall Strength Comments  LE myotomes are intact      Palpation   Palpation comment  TTP to the R ASIS and anterior hip mucsle attachments      Special Tests    Special Tests  Hip Special Tests    Hip Special Tests   Saralyn Pilar (FABER) Test;Hip Scouring      Saralyn Pilar (FABER) Test   Findings  Negative    Side  Right;Left      Hip Scouring   Findings  Negative    Side  Right;Left      Transfers   Transfers  Sit to Stand;Stand to Sit    Sit to Stand  7: Independent      Ambulation/Gait   Ambulation/Gait  Yes    Ambulation/Gait Assistance  7: Independent                  Objective measurements completed on examination: See above findings.              PT Education - 02/18/20 0924    Education Details  Eval finding, POC, and HEP for directional extension preference: prone lying, prone on elbows, prone press ups and standing extension.    Person(s) Educated  Patient    Methods  Explanation;Demonstration;Tactile cues;Verbal cues;Handout    Comprehension  Verbalized understanding;Returned demonstration;Verbal cues required;Tactile cues required;Need further instruction  PT Short Term Goals - 02/18/20 0950      PT SHORT TERM GOAL #1   Title  Pt will be ind in a HEP    Baseline  HEP started c eval today    Time  3    Period  Weeks    Status  New    Target Date  03/10/20      PT SHORT TERM GOAL #2   Title  Pt will return demonstration for proper body mechanics for work and home related activies    Baseline   decreased understanding    Time  3    Period  Weeks    Status  New    Target Date  03/10/20      PT SHORT TERM GOAL #3   Title  Pt will voice undertsanding of measures to assist with pain management and reduction    Baseline  decreased understanding    Time  3    Period  Weeks    Status  New    Target Date  03/10/20        PT Long Term Goals - 02/18/20 0955      PT LONG TERM GOAL #1   Title  Pt will report an improved pain range of 0-3/10 with home and work related activities.    Baseline  2-10/10    Time  7    Period  Weeks    Status  New    Target Date  04/07/20      PT LONG TERM GOAL #2   Title  Pt will be Ind in a final HEP to address pain and functional tolerance    Baseline  In progress    Time  7    Period  Weeks    Status  New    Target Date  04/07/20             Plan - 02/18/20 0933    Clinical Impression Statement  Pt presents with a chronic hx of low back pain, R greater than L. Today pt's pain level was low at a 2/10. With directional preference testing, pts R low back/flank pain increased with knee to chest and was resolved with prone lying and prone press ups. pt was started on a HEP with extension as a directional preference for treatment. Pt will benefit from PT 1w6 to decrease low back/hip pain and improve functional tolerence.    Personal Factors and Comorbidities  Time since onset of injury/illness/exacerbation    Examination-Activity Limitations  Carry;Lift;Squat;Reach Overhead    Stability/Clinical Decision Making  Stable/Uncomplicated    Clinical Decision Making  Low    PT Frequency  1x / week    PT Duration  6 weeks    PT Treatment/Interventions  ADLs/Self Care Home Management;Cryotherapy;Electrical Stimulation;Ultrasound;Traction;Moist Heat;Iontophoresis 50m/ml Dexamethasone;Functional mobility training;Therapeutic activities;Therapeutic exercise;Neuromuscular re-education;Manual techniques;Spinal Manipulations;Dry  needling;Taping;Patient/family education    PT Next Visit Plan  Assess response to HEP. Apply ther ex and modalities as indicated    PT Home Exercise Plan  [id number] HEP for directional extension preference: prone lying, prone on elbows, prone press ups and standing extension.    Consulted and Agree with Plan of Care  Patient       Patient will benefit from skilled therapeutic intervention in order to improve the following deficits and impairments:  Decreased activity tolerance, Postural dysfunction, Improper body mechanics, Decreased knowledge of precautions, Pain, Obesity, Decreased mobility  Visit Diagnosis: Chronic right-sided low back pain, unspecified whether sciatica  present - Plan: PT plan of care cert/re-cert  Pain in left hip - Plan: PT plan of care cert/re-cert  Pain in right hip - Plan: PT plan of care cert/re-cert     Problem List Patient Active Problem List   Diagnosis Date Noted  . Morbid obesity ([place]) 02/09/2013    Gar Ponto MS, PT 02/18/20 10:21 AM   PHYSICAL THERAPY DISCHARGE SUMMARY  Visits from Start of Care: 1  Current functional level related to goals / functional outcomes: Unknown, self DCed   Remaining deficits: Unknown, self DCed   Education / Equipment: Initial HEP Plan:                                                    Patient goals were not met. Patient is being discharged due to not returning since the last visit.  ?????       [place], [place], [address] Phone: [phone]   Fax:  [phone]  Name: Natalie Suarez MRN: [id number] Date of Birth: 1966-01-26

## 2020-02-23 MED FILL — NITROFURANTOIN MONO-MCR 100: 100 | 30 days supply | Qty: 30 | Fill #6

## 2020-02-23 MED FILL — METFORMIN HCL ER 500 MG TB2: 500 | 30 days supply | Qty: 120 | Fill #1

## 2020-02-28 ENCOUNTER — Ambulatory Visit: Payer: No Typology Code available for payment source | Admitting: Physical Therapy

## 2020-03-04 ENCOUNTER — Ambulatory Visit: Payer: No Typology Code available for payment source

## 2020-03-27 MED FILL — NITROFURANTOIN MONO-MCR 100: 100 | 30 days supply | Qty: 30 | Fill #7

## 2020-03-27 MED FILL — METFORMIN HCL ER 500 MG TB2: 500 | 30 days supply | Qty: 120 | Fill #2

## 2020-04-28 MED FILL — NITROFURANTOIN MONO-MCR 100: 100 | 30 days supply | Qty: 30 | Fill #8

## 2020-04-28 MED FILL — PRAVASTATIN NA 40 MG TAB: 40 | 90 days supply | Qty: 90 | Fill #0

## 2020-05-05 ENCOUNTER — Other Ambulatory Visit (HOSPITAL_COMMUNITY): Payer: Self-pay | Admitting: Physician Assistant

## 2020-05-05 MED FILL — METFORMIN HCL ER 500 MG TB2: 500 | 60 days supply | Qty: 240 | Fill #3

## 2020-05-05 MED FILL — CARVEDILOL 6.25 MG TABLET: 6.25 | 90 days supply | Qty: 180 | Fill #0

## 2020-05-05 MED FILL — LISINOPRIL 20 MG TABLET: 20 | 90 days supply | Qty: 90 | Fill #1

## 2020-05-09 MED FILL — NYSTATIN 100,000 UNIT/GM PO: 100000 | 30 days supply | Qty: 15 | Fill #0

## 2020-05-20 MED FILL — NYSTATIN 100,000 UNIT/GM PO: 100000 | 30 days supply | Qty: 15 | Fill #0

## 2020-06-09 ENCOUNTER — Other Ambulatory Visit: Payer: No Typology Code available for payment source

## 2020-07-09 MED FILL — FREESTYLE LITE TEST STRIP: 90 days supply | Qty: 100 | Fill #2

## 2020-07-09 MED FILL — METFORMIN HCL ER 500 MG TB2: 500 | 60 days supply | Qty: 240 | Fill #0

## 2020-07-23 ENCOUNTER — Other Ambulatory Visit (HOSPITAL_COMMUNITY): Payer: Self-pay | Admitting: Physician Assistant

## 2020-07-24 MED FILL — LISINOPRIL 20 MG TABLET: 20 | 90 days supply | Qty: 90 | Fill #0

## 2020-08-06 ENCOUNTER — Other Ambulatory Visit (HOSPITAL_COMMUNITY): Payer: Self-pay | Admitting: Family Medicine

## 2020-08-06 MED FILL — PRAVASTATIN NA 40 MG TAB: 40 | 90 days supply | Qty: 90 | Fill #0

## 2020-08-20 ENCOUNTER — Other Ambulatory Visit (HOSPITAL_COMMUNITY): Payer: Self-pay | Admitting: Physician Assistant

## 2020-08-23 MED FILL — CARVEDILOL 6.25 MG TABLET: 6.25 | 90 days supply | Qty: 180 | Fill #0

## 2020-08-29 MED FILL — OMRON 3 SERIES BP MONITOR D: 30 days supply | Qty: 1 | Fill #0

## 2020-09-23 MED FILL — METFORMIN HCL ER 500 MG TB2: 500 | 60 days supply | Qty: 240 | Fill #1

## 2020-09-28 ENCOUNTER — Other Ambulatory Visit: Payer: No Typology Code available for payment source

## 2020-10-28 MED FILL — LISINOPRIL 20 MG TABLET: 20 | 90 days supply | Qty: 90 | Fill #1

## 2020-11-11 ENCOUNTER — Other Ambulatory Visit: Payer: Self-pay | Admitting: Family Medicine

## 2020-11-11 DIAGNOSIS — Z1231 Encounter for screening mammogram for malignant neoplasm of breast: Secondary | ICD-10-CM

## 2020-11-18 MED FILL — PRAVASTATIN NA 40 MG TAB: 40 | 90 days supply | Qty: 90 | Fill #1

## 2020-12-05 ENCOUNTER — Other Ambulatory Visit (HOSPITAL_COMMUNITY): Payer: Self-pay | Admitting: Family Medicine

## 2020-12-15 ENCOUNTER — Other Ambulatory Visit (HOSPITAL_COMMUNITY): Payer: Self-pay | Admitting: Physician Assistant

## 2020-12-15 MED FILL — CARVEDILOL 6.25 MG TABLET: 6.25 | 90 days supply | Qty: 180 | Fill #0

## 2020-12-18 ENCOUNTER — Other Ambulatory Visit (HOSPITAL_COMMUNITY): Payer: Self-pay

## 2020-12-18 MED FILL — OMRON 3 SERIES BP MONITOR D: 1 days supply | Qty: 1 | Fill #0

## 2020-12-29 ENCOUNTER — Other Ambulatory Visit (HOSPITAL_COMMUNITY): Payer: Self-pay

## 2020-12-29 MED ORDER — METFORMIN HCL ER 500 MG PO TB24
1000.0000 mg | ORAL_TABLET | Freq: Two times a day (BID) | ORAL | 2 refills | Status: DC
  Filled 2020-12-29 – 2021-01-23 (×3): qty 360, 90d supply, fill #0
  Filled 2021-05-05: qty 360, 90d supply, fill #1
  Filled 2021-08-14: qty 360, 90d supply, fill #2

## 2020-12-29 MED ORDER — CARVEDILOL 6.25 MG PO TABS
6.2500 mg | ORAL_TABLET | Freq: Two times a day (BID) | ORAL | 1 refills | Status: DC
  Filled 2020-12-29 – 2021-01-12 (×2): qty 180, 90d supply, fill #0

## 2020-12-29 MED ORDER — LISINOPRIL 20 MG PO TABS
20.0000 mg | ORAL_TABLET | Freq: Every day | ORAL | 2 refills | Status: DC
  Filled 2020-12-29 – 2021-02-13 (×2): qty 90, 90d supply, fill #0

## 2020-12-30 ENCOUNTER — Other Ambulatory Visit (HOSPITAL_COMMUNITY): Payer: Self-pay

## 2020-12-31 ENCOUNTER — Other Ambulatory Visit: Payer: Self-pay

## 2020-12-31 ENCOUNTER — Ambulatory Visit
Admission: RE | Admit: 2020-12-31 | Discharge: 2020-12-31 | Disposition: A | Discharge status: Home / Self Care | Payer: No Typology Code available for payment source | Source: Ambulatory Visit | Attending: Family Medicine | Admitting: Family Medicine

## 2020-12-31 DIAGNOSIS — Z1231 Encounter for screening mammogram for malignant neoplasm of breast: Secondary | ICD-10-CM

## 2021-01-08 ENCOUNTER — Other Ambulatory Visit (HOSPITAL_COMMUNITY): Payer: Self-pay

## 2021-01-12 ENCOUNTER — Other Ambulatory Visit (HOSPITAL_COMMUNITY): Payer: Self-pay

## 2021-01-14 ENCOUNTER — Other Ambulatory Visit (HOSPITAL_COMMUNITY): Payer: Self-pay

## 2021-01-22 ENCOUNTER — Other Ambulatory Visit (HOSPITAL_COMMUNITY): Payer: Self-pay

## 2021-01-22 MED ORDER — AMOXICILLIN-POT CLAVULANATE 875-125 MG PO TABS
1.0000 | ORAL_TABLET | Freq: Two times a day (BID) | ORAL | 0 refills | Status: AC
  Filled 2021-01-22: qty 10, 5d supply, fill #0

## 2021-01-23 ENCOUNTER — Other Ambulatory Visit (HOSPITAL_COMMUNITY): Payer: Self-pay

## 2021-02-02 ENCOUNTER — Other Ambulatory Visit (HOSPITAL_COMMUNITY): Payer: Self-pay

## 2021-02-02 MED ORDER — FLUCONAZOLE 150 MG PO TABS
150.0000 mg | ORAL_TABLET | ORAL | 0 refills | Status: DC
  Filled 2021-02-02: qty 2, 6d supply, fill #0

## 2021-02-12 ENCOUNTER — Other Ambulatory Visit (HOSPITAL_COMMUNITY): Payer: Self-pay

## 2021-02-13 ENCOUNTER — Other Ambulatory Visit (HOSPITAL_COMMUNITY): Payer: Self-pay

## 2021-02-17 ENCOUNTER — Other Ambulatory Visit (HOSPITAL_COMMUNITY): Payer: Self-pay

## 2021-02-23 ENCOUNTER — Other Ambulatory Visit (HOSPITAL_COMMUNITY): Payer: Self-pay

## 2021-02-24 ENCOUNTER — Other Ambulatory Visit (HOSPITAL_COMMUNITY): Payer: Self-pay

## 2021-02-24 MED ORDER — LISINOPRIL 20 MG PO TABS
20.0000 mg | ORAL_TABLET | Freq: Every day | ORAL | 0 refills | Status: DC
  Filled 2021-02-24: qty 90, 90d supply, fill #0

## 2021-02-27 ENCOUNTER — Other Ambulatory Visit (HOSPITAL_COMMUNITY): Payer: Self-pay

## 2021-02-27 MED ORDER — LISINOPRIL 20 MG PO TABS
20.0000 mg | ORAL_TABLET | Freq: Every day | ORAL | 3 refills | Status: DC
  Filled 2021-02-27 – 2021-06-04 (×2): qty 90, 90d supply, fill #0
  Filled 2021-09-07: qty 90, 90d supply, fill #1
  Filled 2021-12-16: qty 90, 90d supply, fill #2

## 2021-02-27 MED ORDER — CARVEDILOL 6.25 MG PO TABS
6.2500 mg | ORAL_TABLET | Freq: Two times a day (BID) | ORAL | 3 refills | Status: DC
  Filled 2021-02-27: qty 180, 90d supply, fill #0
  Filled 2021-07-09: qty 180, 90d supply, fill #1
  Filled 2021-10-19: qty 180, 90d supply, fill #2
  Filled 2022-01-25: qty 180, 90d supply, fill #3

## 2021-02-27 MED ORDER — ALBUTEROL SULFATE HFA 108 (90 BASE) MCG/ACT IN AERS
1.0000 | INHALATION_SPRAY | RESPIRATORY_TRACT | 3 refills | Status: DC
  Filled 2021-02-27: qty 18, 33d supply, fill #0

## 2021-02-27 MED ORDER — METFORMIN HCL ER 500 MG PO TB24
1000.0000 mg | ORAL_TABLET | Freq: Two times a day (BID) | ORAL | 2 refills | Status: DC
  Filled 2021-02-27 – 2022-02-25 (×2): qty 360, 90d supply, fill #0

## 2021-03-01 ENCOUNTER — Other Ambulatory Visit (HOSPITAL_COMMUNITY): Payer: Self-pay

## 2021-03-02 ENCOUNTER — Other Ambulatory Visit (HOSPITAL_COMMUNITY): Payer: Self-pay

## 2021-03-02 MED ORDER — PRAVASTATIN SODIUM 40 MG PO TABS
40.0000 mg | ORAL_TABLET | Freq: Every day | ORAL | 3 refills | Status: DC
  Filled 2021-03-02 – 2021-04-20 (×2): qty 90, 90d supply, fill #0
  Filled 2021-07-28 – 2021-08-05 (×2): qty 90, 90d supply, fill #1
  Filled 2021-11-06: qty 90, 90d supply, fill #2
  Filled 2022-02-10: qty 90, 90d supply, fill #3

## 2021-03-04 ENCOUNTER — Other Ambulatory Visit (HOSPITAL_COMMUNITY): Payer: Self-pay

## 2021-03-10 ENCOUNTER — Other Ambulatory Visit (HOSPITAL_COMMUNITY): Payer: Self-pay

## 2021-03-27 ENCOUNTER — Other Ambulatory Visit (HOSPITAL_COMMUNITY): Payer: Self-pay

## 2021-04-20 ENCOUNTER — Other Ambulatory Visit (HOSPITAL_COMMUNITY): Payer: Self-pay

## 2021-05-06 ENCOUNTER — Other Ambulatory Visit (HOSPITAL_COMMUNITY): Payer: Self-pay

## 2021-06-03 ENCOUNTER — Other Ambulatory Visit (HOSPITAL_COMMUNITY): Payer: Self-pay

## 2021-06-03 MED ORDER — MELOXICAM 15 MG PO TABS
15.0000 mg | ORAL_TABLET | Freq: Every day | ORAL | 0 refills | Status: DC
  Filled 2021-06-03: qty 30, 30d supply, fill #0

## 2021-06-04 ENCOUNTER — Other Ambulatory Visit (HOSPITAL_COMMUNITY): Payer: Self-pay

## 2021-06-04 MED ORDER — LISINOPRIL 20 MG PO TABS
20.0000 mg | ORAL_TABLET | Freq: Every day | ORAL | 0 refills | Status: DC
  Filled 2021-06-04: qty 90, 90d supply, fill #0

## 2021-06-12 ENCOUNTER — Other Ambulatory Visit (HOSPITAL_COMMUNITY): Payer: Self-pay

## 2021-06-12 MED ORDER — CEPHALEXIN 500 MG PO CAPS
500.0000 mg | ORAL_CAPSULE | Freq: Two times a day (BID) | ORAL | 0 refills | Status: DC
  Filled 2021-06-12: qty 14, 7d supply, fill #0

## 2021-07-10 ENCOUNTER — Other Ambulatory Visit (HOSPITAL_COMMUNITY): Payer: Self-pay

## 2021-07-28 ENCOUNTER — Other Ambulatory Visit (HOSPITAL_COMMUNITY): Payer: Self-pay

## 2021-08-05 ENCOUNTER — Other Ambulatory Visit (HOSPITAL_COMMUNITY): Payer: Self-pay

## 2021-08-14 ENCOUNTER — Other Ambulatory Visit (HOSPITAL_COMMUNITY): Payer: Self-pay

## 2021-09-07 ENCOUNTER — Other Ambulatory Visit (HOSPITAL_COMMUNITY): Payer: Self-pay

## 2021-10-19 ENCOUNTER — Other Ambulatory Visit (HOSPITAL_COMMUNITY): Payer: Self-pay

## 2021-10-20 ENCOUNTER — Other Ambulatory Visit (HOSPITAL_COMMUNITY): Payer: Self-pay

## 2021-11-06 ENCOUNTER — Other Ambulatory Visit (HOSPITAL_COMMUNITY): Payer: Self-pay

## 2021-11-17 ENCOUNTER — Other Ambulatory Visit (HOSPITAL_COMMUNITY): Payer: Self-pay

## 2021-11-18 ENCOUNTER — Other Ambulatory Visit (HOSPITAL_COMMUNITY): Payer: Self-pay

## 2021-11-18 MED ORDER — METFORMIN HCL ER 500 MG PO TB24
1000.0000 mg | ORAL_TABLET | Freq: Two times a day (BID) | ORAL | 1 refills | Status: DC
  Filled 2021-11-18: qty 360, 90d supply, fill #0

## 2021-12-07 ENCOUNTER — Other Ambulatory Visit (HOSPITAL_COMMUNITY): Payer: Self-pay

## 2021-12-07 MED ORDER — SULFAMETHOXAZOLE-TRIMETHOPRIM 800-160 MG PO TABS
1.0000 | ORAL_TABLET | Freq: Two times a day (BID) | ORAL | 0 refills | Status: DC
  Filled 2021-12-07: qty 6, 3d supply, fill #0

## 2021-12-15 ENCOUNTER — Other Ambulatory Visit (HOSPITAL_COMMUNITY): Payer: Self-pay

## 2021-12-16 ENCOUNTER — Other Ambulatory Visit (HOSPITAL_COMMUNITY): Payer: Self-pay

## 2022-01-25 ENCOUNTER — Other Ambulatory Visit (HOSPITAL_COMMUNITY): Payer: Self-pay

## 2022-02-08 ENCOUNTER — Other Ambulatory Visit (HOSPITAL_COMMUNITY): Payer: Self-pay

## 2022-02-08 MED ORDER — PREDNISONE 20 MG PO TABS
20.0000 mg | ORAL_TABLET | Freq: Two times a day (BID) | ORAL | 0 refills | Status: DC
  Filled 2022-02-08: qty 10, 5d supply, fill #0

## 2022-02-10 ENCOUNTER — Other Ambulatory Visit (HOSPITAL_COMMUNITY): Payer: Self-pay

## 2022-02-11 ENCOUNTER — Other Ambulatory Visit (HOSPITAL_COMMUNITY): Payer: Self-pay

## 2022-02-12 ENCOUNTER — Other Ambulatory Visit (HOSPITAL_COMMUNITY): Payer: Self-pay

## 2022-02-12 MED ORDER — ALBUTEROL SULFATE HFA 108 (90 BASE) MCG/ACT IN AERS
1.0000 | INHALATION_SPRAY | RESPIRATORY_TRACT | 3 refills | Status: DC | PRN
  Filled 2022-02-12: qty 18, 33d supply, fill #0

## 2022-02-25 ENCOUNTER — Other Ambulatory Visit (HOSPITAL_COMMUNITY): Payer: Self-pay

## 2022-02-26 ENCOUNTER — Other Ambulatory Visit (HOSPITAL_COMMUNITY): Payer: Self-pay

## 2022-03-18 ENCOUNTER — Other Ambulatory Visit (HOSPITAL_COMMUNITY): Payer: Self-pay

## 2022-03-18 MED ORDER — LISINOPRIL 20 MG PO TABS
20.0000 mg | ORAL_TABLET | Freq: Every day | ORAL | 0 refills | Status: DC
  Filled 2022-03-18: qty 90, 90d supply, fill #0

## 2022-03-29 ENCOUNTER — Other Ambulatory Visit (HOSPITAL_COMMUNITY): Payer: Self-pay

## 2022-04-22 IMAGING — MG DIGITAL SCREENING BILAT W/ CAD
5 series · 5 of 5 positions shown · non-contrast
Comparison: Previous exam(s).

CLINICAL DATA: Screening.

EXAM:
DIGITAL SCREENING BILATERAL MAMMOGRAM WITH CAD
TECHNIQUE: Bilateral screening digital craniocaudal and mediolateral oblique
mammograms were obtained. The images were evaluated with
computer-aided detection.

[L MLO (1 of 2)]
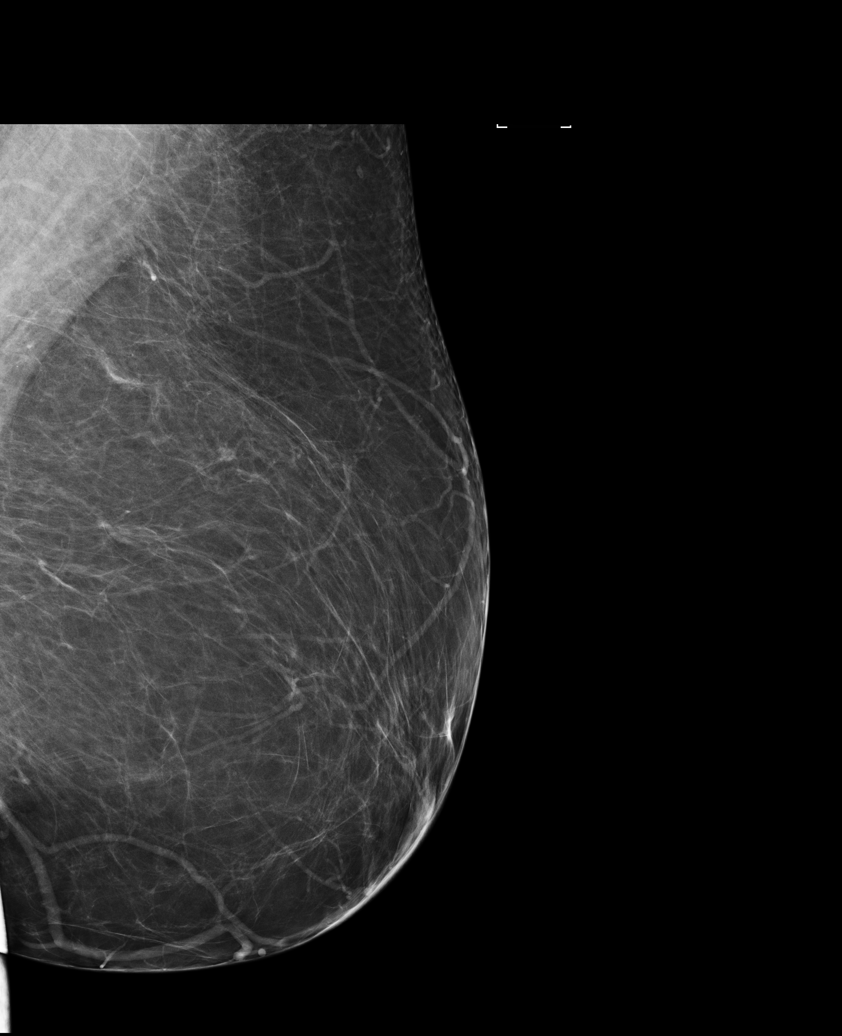

[L CC]
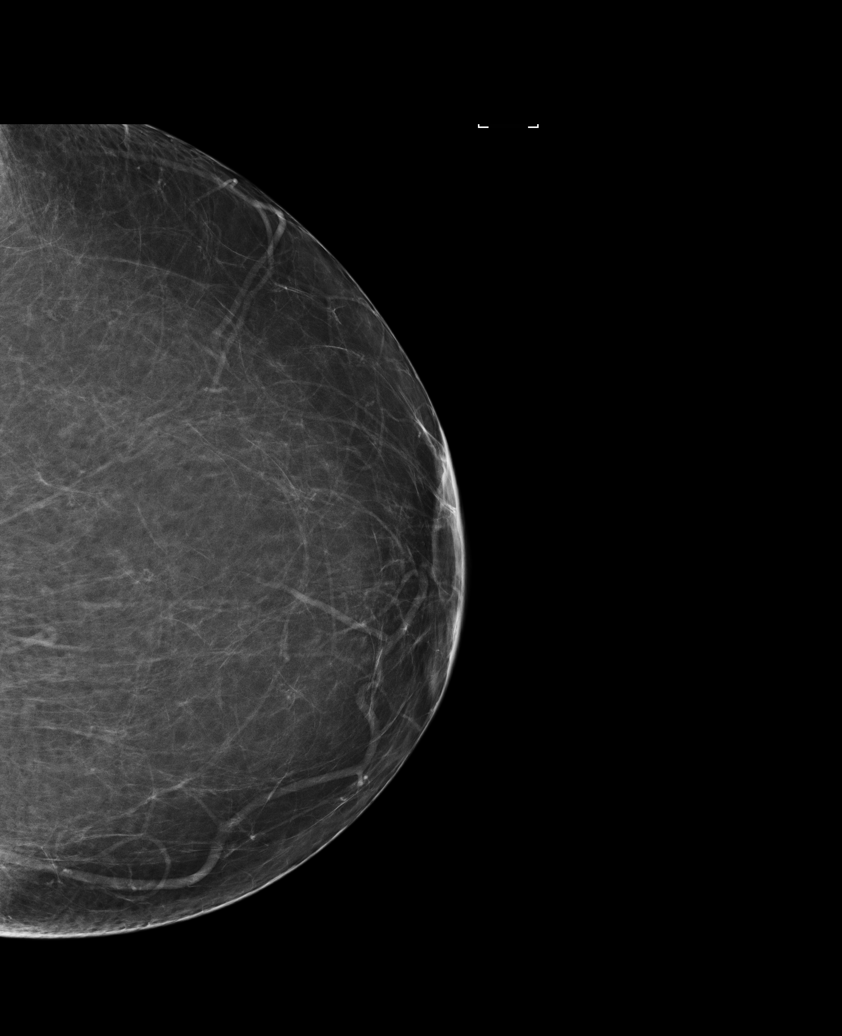

[R MLO]
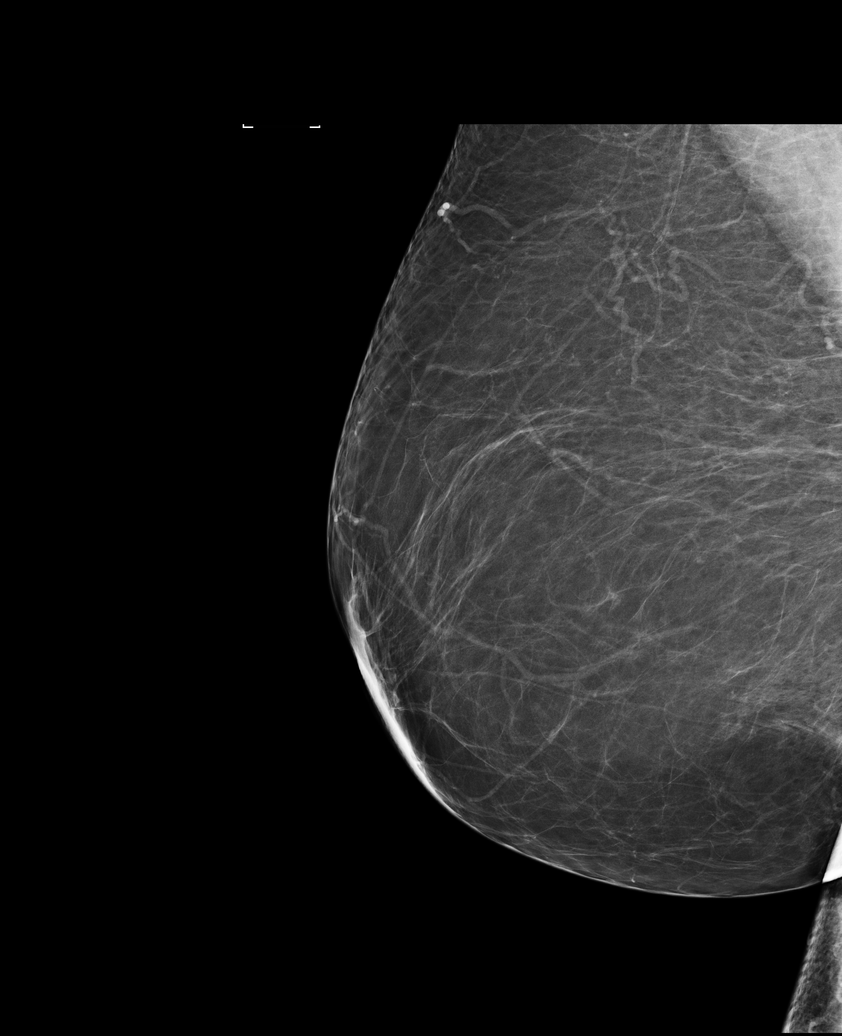

[L MLO (2 of 2)]
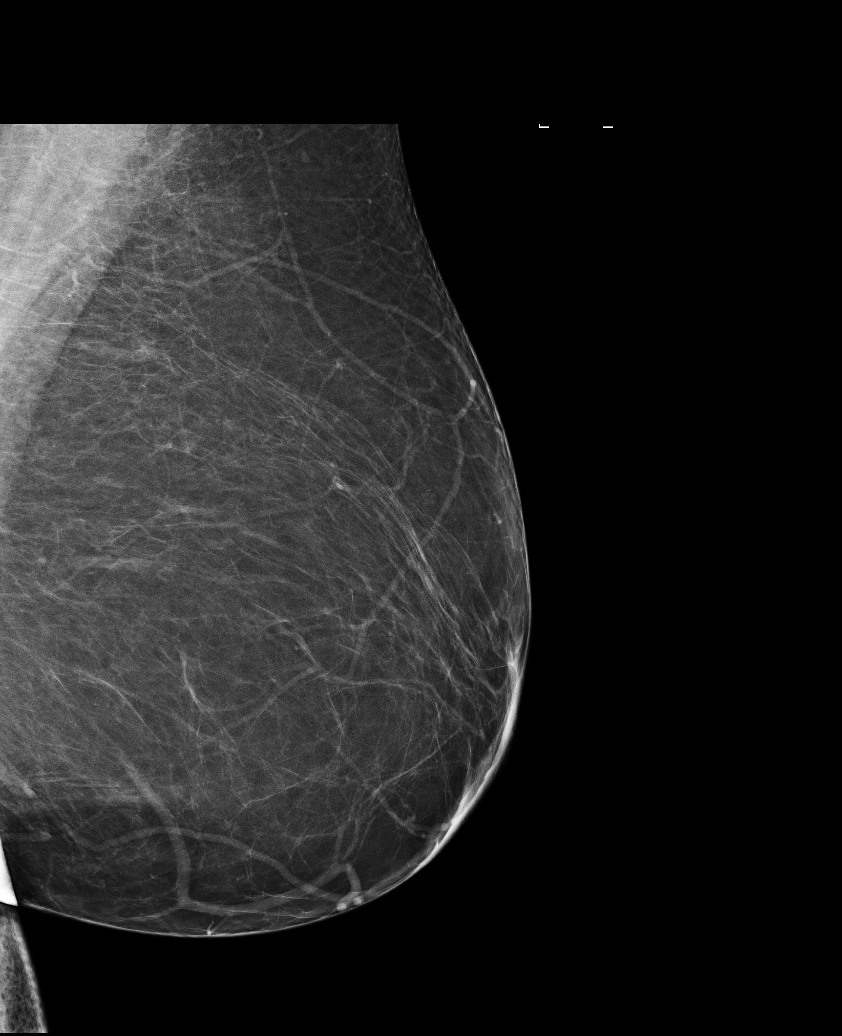

[R CC]
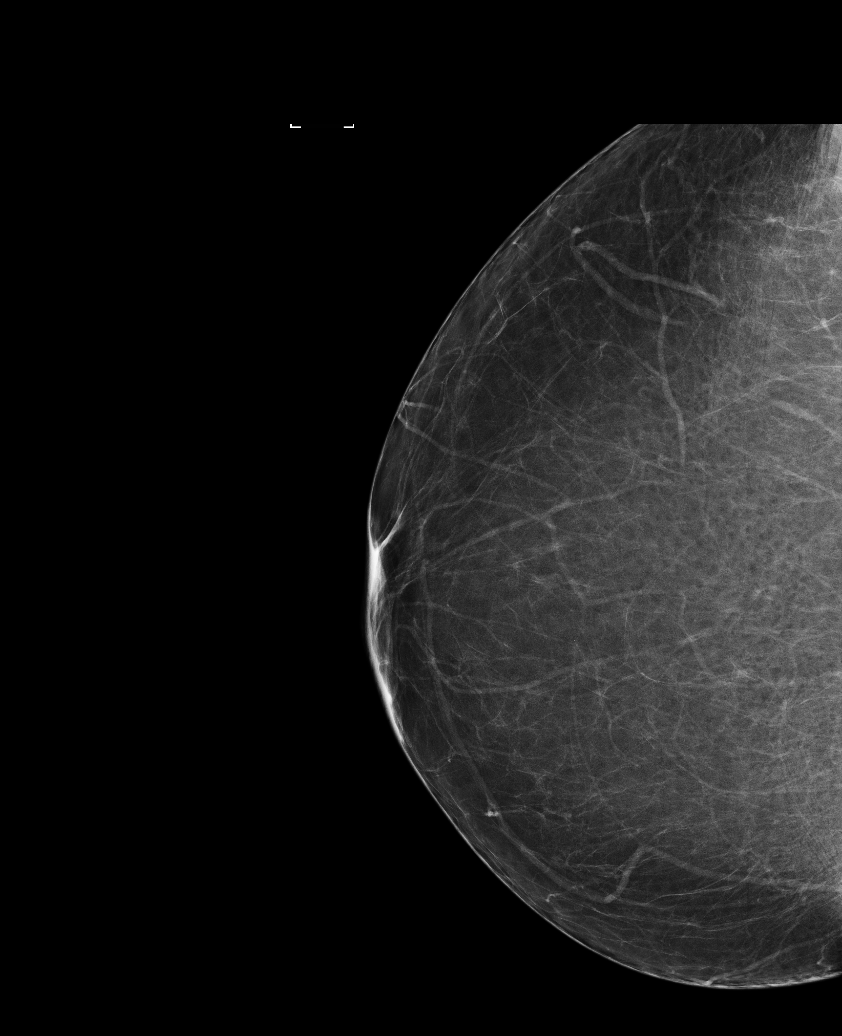

[5 of 5 positions shown; findings below may reference images not displayed]

ACR Breast Density Category b: There are scattered areas of
fibroglandular density.
FINDINGS: There are no findings suspicious for malignancy. The images were
evaluated with computer-aided detection.
IMPRESSION: No mammographic evidence of malignancy. A result letter of this
screening mammogram will be mailed directly to the patient.

RECOMMENDATION:
Screening mammogram in one year. (Code:[id number])

BI-RADS CATEGORY  1: Negative.

## 2022-04-28 ENCOUNTER — Other Ambulatory Visit (HOSPITAL_COMMUNITY): Payer: Self-pay

## 2022-04-28 MED ORDER — CARVEDILOL 6.25 MG PO TABS
6.2500 mg | ORAL_TABLET | Freq: Two times a day (BID) | ORAL | 3 refills | Status: DC
  Filled 2022-04-28: qty 180, 90d supply, fill #0
  Filled 2022-08-03: qty 180, 90d supply, fill #1

## 2022-04-30 ENCOUNTER — Other Ambulatory Visit (HOSPITAL_COMMUNITY): Payer: Self-pay

## 2022-05-25 ENCOUNTER — Other Ambulatory Visit (HOSPITAL_COMMUNITY): Payer: Self-pay

## 2022-05-27 ENCOUNTER — Other Ambulatory Visit (HOSPITAL_COMMUNITY): Payer: Self-pay

## 2022-05-27 MED ORDER — METFORMIN HCL ER 500 MG PO TB24
1000.0000 mg | ORAL_TABLET | Freq: Two times a day (BID) | ORAL | 2 refills | Status: DC
  Filled 2022-05-27: qty 360, 90d supply, fill #0
  Filled 2023-03-04: qty 360, 90d supply, fill #1

## 2022-05-27 MED ORDER — PRAVASTATIN SODIUM 40 MG PO TABS
40.0000 mg | ORAL_TABLET | Freq: Every day | ORAL | 3 refills | Status: DC
  Filled 2022-05-27: qty 90, 90d supply, fill #0
  Filled 2022-12-01: qty 90, 90d supply, fill #1
  Filled 2023-03-04: qty 90, 90d supply, fill #2

## 2022-06-18 ENCOUNTER — Other Ambulatory Visit (HOSPITAL_COMMUNITY): Payer: Self-pay

## 2022-06-22 ENCOUNTER — Other Ambulatory Visit (HOSPITAL_COMMUNITY): Payer: Self-pay

## 2022-06-22 MED ORDER — LISINOPRIL 20 MG PO TABS
20.0000 mg | ORAL_TABLET | Freq: Every day | ORAL | 0 refills | Status: DC
  Filled 2022-06-22: qty 90, 90d supply, fill #0

## 2022-06-22 MED ORDER — NITROGLYCERIN 0.4 MG/HR TD PT24
MEDICATED_PATCH | TRANSDERMAL | 0 refills | Status: DC
  Filled 2022-06-22: qty 7, 28d supply, fill #0

## 2022-06-23 ENCOUNTER — Other Ambulatory Visit (HOSPITAL_COMMUNITY): Payer: Self-pay

## 2022-06-23 MED ORDER — FREESTYLE LITE TEST VI STRP
ORAL_STRIP | Freq: Every day | 3 refills | Status: AC
  Filled 2022-06-23: qty 50, 50d supply, fill #0

## 2022-06-23 MED ORDER — FREESTYLE LANCETS MISC
Freq: Every day | 3 refills | Status: AC
  Filled 2022-06-23: qty 100, 90d supply, fill #0

## 2022-06-24 ENCOUNTER — Other Ambulatory Visit (HOSPITAL_COMMUNITY): Payer: Self-pay

## 2022-07-05 ENCOUNTER — Other Ambulatory Visit (HOSPITAL_COMMUNITY): Payer: Self-pay

## 2022-07-06 ENCOUNTER — Other Ambulatory Visit (HOSPITAL_COMMUNITY): Payer: Self-pay

## 2022-07-06 MED ORDER — PREDNISONE 20 MG PO TABS
ORAL_TABLET | ORAL | 0 refills | Status: DC
  Filled 2022-07-06: qty 15, 10d supply, fill #0

## 2022-07-06 MED ORDER — CYCLOBENZAPRINE HCL 5 MG PO TABS
5.0000 mg | ORAL_TABLET | Freq: Three times a day (TID) | ORAL | 0 refills | Status: AC | PRN
  Filled 2022-07-06: qty 21, 7d supply, fill #0

## 2022-07-23 ENCOUNTER — Other Ambulatory Visit (HOSPITAL_COMMUNITY): Payer: Self-pay

## 2022-08-03 ENCOUNTER — Other Ambulatory Visit (HOSPITAL_COMMUNITY): Payer: Self-pay

## 2022-08-26 ENCOUNTER — Other Ambulatory Visit (HOSPITAL_COMMUNITY): Payer: Self-pay

## 2022-08-26 MED ORDER — PRAVASTATIN SODIUM 40 MG PO TABS
40.0000 mg | ORAL_TABLET | Freq: Every day | ORAL | 3 refills | Status: DC
  Filled 2022-08-26: qty 90, 90d supply, fill #0
  Filled 2023-06-06: qty 90, 90d supply, fill #1

## 2022-08-26 MED ORDER — METFORMIN HCL ER 500 MG PO TB24
1000.0000 mg | ORAL_TABLET | Freq: Two times a day (BID) | ORAL | 1 refills | Status: DC
  Filled 2022-08-26: qty 360, 90d supply, fill #0
  Filled 2022-12-01: qty 360, 90d supply, fill #1

## 2022-08-26 MED ORDER — LISINOPRIL 20 MG PO TABS
20.0000 mg | ORAL_TABLET | Freq: Every day | ORAL | 1 refills | Status: DC
  Filled 2022-09-29: qty 90, 90d supply, fill #0

## 2022-08-26 MED ORDER — CARVEDILOL 6.25 MG PO TABS
6.2500 mg | ORAL_TABLET | Freq: Two times a day (BID) | ORAL | 1 refills | Status: DC
  Filled 2022-08-26 – 2022-11-13 (×2): qty 180, 90d supply, fill #0
  Filled 2023-02-14: qty 180, 90d supply, fill #1

## 2022-09-09 ENCOUNTER — Ambulatory Visit: Payer: No Typology Code available for payment source | Admitting: Podiatry

## 2022-09-23 ENCOUNTER — Ambulatory Visit (INDEPENDENT_AMBULATORY_CARE_PROVIDER_SITE_OTHER): Payer: 59 | Admitting: Podiatry

## 2022-09-23 ENCOUNTER — Other Ambulatory Visit (HOSPITAL_COMMUNITY): Payer: Self-pay

## 2022-09-23 DIAGNOSIS — B353 Tinea pedis: Secondary | ICD-10-CM

## 2022-09-23 DIAGNOSIS — L84 Corns and callosities: Secondary | ICD-10-CM | POA: Diagnosis not present

## 2022-09-23 MED ORDER — CLOTRIMAZOLE-BETAMETHASONE 1-0.05 % EX CREA
1.0000 | TOPICAL_CREAM | Freq: Every day | CUTANEOUS | 0 refills | Status: DC
  Filled 2022-09-23: qty 30, 30d supply, fill #0

## 2022-09-23 NOTE — Patient Instructions (Signed)
Look for urea 40% with salicylic acid cream or ointment and apply to the thickened dry skin / calluses. This can be bought over the counter, at a pharmacy or online such as [employer].  

## 2022-09-24 ENCOUNTER — Other Ambulatory Visit (HOSPITAL_COMMUNITY): Payer: Self-pay

## 2022-09-27 NOTE — Progress Notes (Signed)
  Subjective:  Patient ID: Natalie Suarez, female    DOB: 1966/03/21,  MRN: [id number]  Chief Complaint  Patient presents with   Callouses    (np) bil Corns and callosities; more severe on the left    57 y.o. female presents with the above complaint. History confirmed with patient.  They sometimes become painful for her especially in shoes.  She has dry scaling skin that is hard to get rid of  Objective:  Physical Exam: warm, good capillary refill, no trophic changes or ulcerative lesions, normal DP and PT pulses, normal sensory exam, and dry scaling cracking skin on medial heel medially MPJ, some maceration in the fourth interspace bilateral  Assessment:   1. Callus of foot   2. Tinea pedis of both feet      Plan:  Patient was evaluated and treated and all questions answered.  Discussed with her that is a combination of hyperkeratosis and likely tinea pedis infection.  We discussed etiology and treatment options of this.  I recommended cleaning foot surfaces such as shower as well as inside of shoes with antifungal spray.  Lotrisone Rx sent to pharmacy.  Also recommended urea cream for the calluses themselves.  Return if symptoms worsen or fail to improve.

## 2022-09-30 ENCOUNTER — Other Ambulatory Visit (HOSPITAL_COMMUNITY): Payer: Self-pay

## 2022-10-08 DIAGNOSIS — L918 Other hypertrophic disorders of the skin: Secondary | ICD-10-CM | POA: Diagnosis not present

## 2022-11-01 DIAGNOSIS — R079 Chest pain, unspecified: Secondary | ICD-10-CM | POA: Diagnosis not present

## 2022-11-01 DIAGNOSIS — K224 Dyskinesia of esophagus: Secondary | ICD-10-CM | POA: Diagnosis not present

## 2022-11-01 DIAGNOSIS — K219 Gastro-esophageal reflux disease without esophagitis: Secondary | ICD-10-CM | POA: Diagnosis not present

## 2022-11-13 ENCOUNTER — Other Ambulatory Visit (HOSPITAL_COMMUNITY): Payer: Self-pay

## 2022-12-01 ENCOUNTER — Other Ambulatory Visit (HOSPITAL_COMMUNITY): Payer: Self-pay

## 2022-12-08 ENCOUNTER — Other Ambulatory Visit: Payer: Self-pay | Admitting: Family Medicine

## 2022-12-08 DIAGNOSIS — Z1231 Encounter for screening mammogram for malignant neoplasm of breast: Secondary | ICD-10-CM

## 2022-12-27 ENCOUNTER — Other Ambulatory Visit (HOSPITAL_COMMUNITY): Payer: Self-pay

## 2022-12-27 DIAGNOSIS — Z6839 Body mass index (BMI) 39.0-39.9, adult: Secondary | ICD-10-CM | POA: Diagnosis not present

## 2022-12-27 DIAGNOSIS — E1169 Type 2 diabetes mellitus with other specified complication: Secondary | ICD-10-CM | POA: Diagnosis not present

## 2022-12-27 MED ORDER — LISINOPRIL 20 MG PO TABS
20.0000 mg | ORAL_TABLET | Freq: Every day | ORAL | 1 refills | Status: DC
  Filled 2022-12-27: qty 90, 90d supply, fill #0
  Filled 2023-04-05: qty 90, 90d supply, fill #1

## 2022-12-27 MED ORDER — OZEMPIC (0.25 OR 0.5 MG/DOSE) 2 MG/3ML ~~LOC~~ SOPN
0.2500 mg | PEN_INJECTOR | SUBCUTANEOUS | 1 refills | Status: DC
  Filled 2022-12-27: qty 3, 30d supply, fill #0

## 2022-12-29 ENCOUNTER — Other Ambulatory Visit (HOSPITAL_COMMUNITY): Payer: Self-pay

## 2022-12-30 ENCOUNTER — Other Ambulatory Visit (HOSPITAL_COMMUNITY): Payer: Self-pay

## 2023-01-21 ENCOUNTER — Ambulatory Visit
Admission: RE | Admit: 2023-01-21 | Discharge: 2023-01-21 | Disposition: A | Discharge status: Home / Self Care | Payer: Self-pay | Source: Ambulatory Visit | Attending: Family Medicine | Admitting: Family Medicine

## 2023-01-21 DIAGNOSIS — Z1231 Encounter for screening mammogram for malignant neoplasm of breast: Secondary | ICD-10-CM | POA: Diagnosis not present

## 2023-02-01 DIAGNOSIS — H2512 Age-related nuclear cataract, left eye: Secondary | ICD-10-CM | POA: Diagnosis not present

## 2023-02-01 DIAGNOSIS — E119 Type 2 diabetes mellitus without complications: Secondary | ICD-10-CM | POA: Diagnosis not present

## 2023-02-01 DIAGNOSIS — H5052 Exophoria: Secondary | ICD-10-CM | POA: Diagnosis not present

## 2023-02-11 ENCOUNTER — Other Ambulatory Visit (HOSPITAL_BASED_OUTPATIENT_CLINIC_OR_DEPARTMENT_OTHER): Payer: Self-pay

## 2023-02-11 ENCOUNTER — Other Ambulatory Visit (HOSPITAL_COMMUNITY): Payer: Self-pay

## 2023-02-16 ENCOUNTER — Other Ambulatory Visit (HOSPITAL_COMMUNITY): Payer: Self-pay

## 2023-02-18 ENCOUNTER — Other Ambulatory Visit (HOSPITAL_COMMUNITY): Payer: Self-pay

## 2023-02-19 ENCOUNTER — Other Ambulatory Visit (HOSPITAL_COMMUNITY): Payer: Self-pay

## 2023-02-21 ENCOUNTER — Other Ambulatory Visit (HOSPITAL_COMMUNITY): Payer: Self-pay

## 2023-02-25 ENCOUNTER — Other Ambulatory Visit (HOSPITAL_COMMUNITY): Payer: Self-pay

## 2023-02-25 MED ORDER — ALBUTEROL SULFATE HFA 108 (90 BASE) MCG/ACT IN AERS
1.0000 | INHALATION_SPRAY | RESPIRATORY_TRACT | 3 refills | Status: DC | PRN
  Filled 2023-02-25: qty 6.7, 30d supply, fill #0
  Filled 2023-09-16: qty 6.7, 17d supply, fill #1
  Filled 2023-09-17: qty 6.7, 30d supply, fill #1
  Filled 2024-02-16: qty 6.7, 30d supply, fill #2

## 2023-03-04 ENCOUNTER — Other Ambulatory Visit (HOSPITAL_COMMUNITY): Payer: Self-pay

## 2023-03-05 ENCOUNTER — Other Ambulatory Visit (HOSPITAL_COMMUNITY): Payer: Self-pay

## 2023-03-28 ENCOUNTER — Other Ambulatory Visit (HOSPITAL_COMMUNITY): Payer: Self-pay

## 2023-03-28 DIAGNOSIS — I10 Essential (primary) hypertension: Secondary | ICD-10-CM | POA: Diagnosis not present

## 2023-03-28 DIAGNOSIS — R202 Paresthesia of skin: Secondary | ICD-10-CM | POA: Diagnosis not present

## 2023-03-28 DIAGNOSIS — M545 Low back pain, unspecified: Secondary | ICD-10-CM | POA: Diagnosis not present

## 2023-03-28 DIAGNOSIS — Z6839 Body mass index (BMI) 39.0-39.9, adult: Secondary | ICD-10-CM | POA: Diagnosis not present

## 2023-03-28 DIAGNOSIS — R519 Headache, unspecified: Secondary | ICD-10-CM | POA: Diagnosis not present

## 2023-03-28 MED ORDER — NITROFURANTOIN MONOHYD MACRO 100 MG PO CAPS
100.0000 mg | ORAL_CAPSULE | Freq: Two times a day (BID) | ORAL | 0 refills | Status: DC
  Filled 2023-03-28: qty 10, 5d supply, fill #0

## 2023-03-29 ENCOUNTER — Other Ambulatory Visit: Payer: Self-pay | Admitting: Family Medicine

## 2023-03-29 DIAGNOSIS — G44201 Tension-type headache, unspecified, intractable: Secondary | ICD-10-CM

## 2023-04-05 ENCOUNTER — Other Ambulatory Visit (HOSPITAL_COMMUNITY): Payer: Self-pay

## 2023-04-13 ENCOUNTER — Ambulatory Visit
Admission: RE | Admit: 2023-04-13 | Discharge: 2023-04-13 | Disposition: A | Discharge status: Home / Self Care | Payer: 59 | Source: Ambulatory Visit | Attending: Family Medicine | Admitting: Family Medicine

## 2023-04-13 DIAGNOSIS — G44201 Tension-type headache, unspecified, intractable: Secondary | ICD-10-CM

## 2023-04-13 DIAGNOSIS — R519 Headache, unspecified: Secondary | ICD-10-CM | POA: Diagnosis not present

## 2023-04-13 DIAGNOSIS — G44209 Tension-type headache, unspecified, not intractable: Secondary | ICD-10-CM | POA: Diagnosis not present

## 2023-04-13 DIAGNOSIS — R6884 Jaw pain: Secondary | ICD-10-CM | POA: Diagnosis not present

## 2023-04-25 DIAGNOSIS — E1169 Type 2 diabetes mellitus with other specified complication: Secondary | ICD-10-CM | POA: Diagnosis not present

## 2023-04-25 DIAGNOSIS — R519 Headache, unspecified: Secondary | ICD-10-CM | POA: Diagnosis not present

## 2023-04-25 DIAGNOSIS — I10 Essential (primary) hypertension: Secondary | ICD-10-CM | POA: Diagnosis not present

## 2023-04-25 DIAGNOSIS — E65 Localized adiposity: Secondary | ICD-10-CM | POA: Diagnosis not present

## 2023-04-25 DIAGNOSIS — Z6838 Body mass index (BMI) 38.0-38.9, adult: Secondary | ICD-10-CM | POA: Diagnosis not present

## 2023-04-25 DIAGNOSIS — M25551 Pain in right hip: Secondary | ICD-10-CM | POA: Diagnosis not present

## 2023-05-17 ENCOUNTER — Other Ambulatory Visit (HOSPITAL_COMMUNITY): Payer: Self-pay

## 2023-05-17 DIAGNOSIS — M5136 Other intervertebral disc degeneration, lumbar region: Secondary | ICD-10-CM | POA: Diagnosis not present

## 2023-05-17 DIAGNOSIS — M5451 Vertebrogenic low back pain: Secondary | ICD-10-CM | POA: Diagnosis not present

## 2023-05-17 MED ORDER — PREDNISONE 5 MG (21) PO TBPK
ORAL_TABLET | ORAL | 1 refills | Status: DC
  Filled 2023-05-17: qty 21, 6d supply, fill #0

## 2023-05-27 ENCOUNTER — Other Ambulatory Visit (HOSPITAL_COMMUNITY): Payer: Self-pay

## 2023-05-30 ENCOUNTER — Other Ambulatory Visit (HOSPITAL_COMMUNITY): Payer: Self-pay

## 2023-05-31 ENCOUNTER — Other Ambulatory Visit (HOSPITAL_COMMUNITY): Payer: Self-pay

## 2023-05-31 MED ORDER — CARVEDILOL 6.25 MG PO TABS
6.2500 mg | ORAL_TABLET | Freq: Two times a day (BID) | ORAL | 0 refills | Status: DC
  Filled 2023-05-31: qty 180, 90d supply, fill #0

## 2023-06-05 ENCOUNTER — Other Ambulatory Visit (HOSPITAL_COMMUNITY): Payer: Self-pay

## 2023-06-06 ENCOUNTER — Other Ambulatory Visit (HOSPITAL_COMMUNITY): Payer: Self-pay

## 2023-06-07 ENCOUNTER — Other Ambulatory Visit (HOSPITAL_COMMUNITY): Payer: Self-pay

## 2023-06-07 MED ORDER — METFORMIN HCL ER 500 MG PO TB24
1000.0000 mg | ORAL_TABLET | Freq: Two times a day (BID) | ORAL | 0 refills | Status: DC
  Filled 2023-06-07 – 2023-06-10 (×2): qty 360, 90d supply, fill #0

## 2023-06-07 MED ORDER — PRAVASTATIN SODIUM 40 MG PO TABS
40.0000 mg | ORAL_TABLET | Freq: Every day | ORAL | 0 refills | Status: DC
Start: 2023-06-07 — End: 2023-10-23

## 2023-06-10 ENCOUNTER — Other Ambulatory Visit (HOSPITAL_COMMUNITY): Payer: Self-pay

## 2023-06-13 ENCOUNTER — Other Ambulatory Visit (HOSPITAL_COMMUNITY): Payer: Self-pay

## 2023-07-05 ENCOUNTER — Other Ambulatory Visit (HOSPITAL_COMMUNITY): Payer: Self-pay

## 2023-07-07 ENCOUNTER — Other Ambulatory Visit (HOSPITAL_COMMUNITY): Payer: Self-pay

## 2023-07-12 ENCOUNTER — Other Ambulatory Visit (HOSPITAL_COMMUNITY): Payer: Self-pay

## 2023-07-12 MED ORDER — LISINOPRIL 20 MG PO TABS
20.0000 mg | ORAL_TABLET | Freq: Every day | ORAL | 0 refills | Status: DC
  Filled 2023-07-12: qty 90, 90d supply, fill #0

## 2023-07-14 ENCOUNTER — Other Ambulatory Visit (HOSPITAL_COMMUNITY): Payer: Self-pay

## 2023-07-14 DIAGNOSIS — Z6839 Body mass index (BMI) 39.0-39.9, adult: Secondary | ICD-10-CM | POA: Diagnosis not present

## 2023-07-14 DIAGNOSIS — B354 Tinea corporis: Secondary | ICD-10-CM | POA: Diagnosis not present

## 2023-07-14 MED ORDER — CLOTRIMAZOLE-BETAMETHASONE 1-0.05 % EX CREA
TOPICAL_CREAM | CUTANEOUS | 1 refills | Status: DC
  Filled 2023-07-14: qty 15, 14d supply, fill #0

## 2023-07-26 ENCOUNTER — Ambulatory Visit: Payer: 59 | Admitting: Plastic Surgery

## 2023-07-26 ENCOUNTER — Encounter: Payer: Self-pay | Admitting: Plastic Surgery

## 2023-07-26 DIAGNOSIS — R21 Rash and other nonspecific skin eruption: Secondary | ICD-10-CM | POA: Diagnosis not present

## 2023-07-26 DIAGNOSIS — M549 Dorsalgia, unspecified: Secondary | ICD-10-CM

## 2023-07-26 DIAGNOSIS — M793 Panniculitis, unspecified: Secondary | ICD-10-CM | POA: Insufficient documentation

## 2023-07-26 NOTE — Progress Notes (Signed)
Patient ID: Natalie Suarez, female    DOB: [birth year], 57 y.o.   MRN: [id number]   Chief Complaint  Patient presents with   Advice Only    The patient is a 57 year old female here for evaluation of her abdomen.  She is 5 feet 2 inches tall weighs 221 pounds.  Her last hemoglobin A1c was 6.4.  She has prediabetes, hypertension, anxiety, and hyperlipidemia.  She suffers from gout and back pain.  She also has panniculitis and that is the main thing we are looking at today.  She has lost about 20 pounds on her own over the past 5 months.  But she does have to use creams and lotions to try and keep down the skin breakdown she does have some pictures of that and she is cysts get some more and upload them onto her chart.  Her past surgical history is positive for hysterectomy, C-section, tubal ligation and a breast reduction.  She does not have any signs of a hernia.  And she is hopeful that she can get down to under 200 pounds.    Review of Systems  Constitutional:  Positive for activity change. Negative for appetite change.  Eyes: Negative.   Respiratory: Negative.    Cardiovascular: Negative.   Gastrointestinal: Negative.   Endocrine: Negative.   Genitourinary: Negative.   Musculoskeletal:  Positive for back pain.  Skin:  Positive for rash.    Past Medical History:  Diagnosis Date   Allergy    Hyperlipidemia    Hypertension     Past Surgical History:  Procedure Laterality Date   ABDOMINAL HYSTERECTOMY  2000   total   BRAIN SURGERY  2004   reduction   CESAREAN SECTION  1989   ear surgery  1980   TUBAL LIGATION  1999      Current Outpatient Medications:    albuterol (VENTOLIN HFA) 108 (90 Base) MCG/ACT inhaler, Inhale 1 puff into the lungs every 4 (four) hours as needed, Disp: 18 g, Rfl: 3   albuterol (VENTOLIN HFA) 108 (90 Base) MCG/ACT inhaler, Inhale 1 puff into the lungs every 4 (four) hours as needed., Disp: 6.7 g, Rfl: 3   carvedilol (COREG) 6.25 MG tablet, Take 1  tablet (6.25 mg total) by mouth 2 (two) times daily with a meal., Disp: 60 tablet, Rfl: 1   carvedilol (COREG) 6.25 MG tablet, Take 1 tablet (6.25 mg total) by mouth 2 (two) times daily with food, Disp: 180 tablet, Rfl: 1   carvedilol (COREG) 6.25 MG tablet, Take 1 tablet (6.25 mg total) by mouth 2 (two) times daily with food, Disp: 180 tablet, Rfl: 3   carvedilol (COREG) 6.25 MG tablet, Take 1 tablet (6.25 mg total) by mouth 2 (two) times daily with food., Disp: 180 tablet, Rfl: 0   cholecalciferol (VITAMIN D3) 25 MCG (1000 UT) tablet, Take 1,000 Units by mouth daily., Disp: , Rfl:    clotrimazole-betamethasone (LOTRISONE) cream, Apply topically daily as directed, Disp: 30 g, Rfl: 0   clotrimazole-betamethasone (LOTRISONE) cream, Apply externally twice a day for 14 days, Disp: 15 g, Rfl: 1   cyclobenzaprine (FLEXERIL) 5 MG tablet, Take 1 tablet (5 mg total) by mouth 3 (three) times daily as needed., Disp: 21 tablet, Rfl: 0   glucose blood (FREESTYLE LITE) test strip, Use to check blood sugar once daily as directed, Disp: 100 strip, Rfl: 3   Lancets (FREESTYLE) lancets, Use once daily as directed, Disp: 100 each, Rfl: 3   lisinopril (  ZESTRIL) 20 MG tablet, Take 1 tablet (20 mg total) by mouth daily., Disp: 90 tablet, Rfl: 2   lisinopril (ZESTRIL) 20 MG tablet, Take 1 tablet (20 mg total) by mouth daily., Disp: 90 tablet, Rfl: 0   lisinopril (ZESTRIL) 20 MG tablet, Take 1 tablet (20 mg total) by mouth daily., Disp: 90 tablet, Rfl: 0   lisinopril (ZESTRIL) 20 MG tablet, Take 1 tablet (20 mg total) by mouth daily., Disp: 90 tablet, Rfl: 0   lisinopril (ZESTRIL) 20 MG tablet, Take 1 tablet (20 mg total) by mouth daily., Disp: 90 tablet, Rfl: 1   lisinopril (ZESTRIL) 20 MG tablet, Take 1 tablet (20 mg total) by mouth daily., Disp: 90 tablet, Rfl: 0   meloxicam (MOBIC) 15 MG tablet, Take 1 tablet (15 mg total) by mouth daily., Disp: 30 tablet, Rfl: 0   metFORMIN (GLUCOPHAGE-XR) 500 MG 24 hr tablet, Take  1,000 mg by mouth 2 (two) times daily., Disp: , Rfl:    metFORMIN (GLUCOPHAGE-XR) 500 MG 24 hr tablet, Take 2 tablets (1,000 mg total) by mouth 2 (two) times daily with a meal., Disp: 360 tablet, Rfl: 1   metFORMIN (GLUCOPHAGE-XR) 500 MG 24 hr tablet, Take 2 tablets (1,000 mg total) by mouth 2 (two) times daily with a meal., Disp: 360 tablet, Rfl: 2   metFORMIN (GLUCOPHAGE-XR) 500 MG 24 hr tablet, Take 2 tablets (1,000 mg total) by mouth 2 (two) times daily with a meal., Disp: 360 tablet, Rfl: 1   metFORMIN (GLUCOPHAGE-XR) 500 MG 24 hr tablet, Take 2 tablets (1,000 mg total) by mouth 2 (two) times daily., Disp: 360 tablet, Rfl: 0   nitrofurantoin, macrocrystal-monohydrate, (MACROBID) 100 MG capsule, Take 1 capsule (100 mg) by mouth every 12 hours for 5 days, Disp: 10 capsule, Rfl: 0   nitroGLYCERIN (NITRODUR - DOSED IN MG/24 HR) 0.4 mg/hr patch, Apply 1/4 patch topically once a day, Disp: 30 patch, Rfl: 0   pravastatin (PRAVACHOL) 40 MG tablet, Take 1 tablet (40 mg total) by mouth daily., Disp: 90 tablet, Rfl: 3   pravastatin (PRAVACHOL) 40 MG tablet, Take 1 tablet (40 mg total) by mouth daily., Disp: 90 tablet, Rfl: 3   pravastatin (PRAVACHOL) 40 MG tablet, Take 1 tablet (40 mg) by mouth daily, Disp: 90 tablet, Rfl: 0   predniSONE (DELTASONE) 20 MG tablet, Take 1 tablet (20 mg total) by mouth 2 (two) times daily., Disp: 10 tablet, Rfl: 0   predniSONE (DELTASONE) 20 MG tablet, Take 2 tablets by mouth in the mornings for 5 days,then 1 tablet every morning for 5 days, Disp: 15 tablet, Rfl: 0   predniSONE (STERAPRED UNI-PAK 21 TAB) 5 MG (21) TBPK tablet, Take as directed for 6 days, Disp: 21 tablet, Rfl: 1   Semaglutide,0.25 or 0.5MG /DOS, (OZEMPIC, 0.25 OR 0.5 MG/DOSE,) 2 MG/3ML SOPN, Inject 0.25 mg into the skin once a week., Disp: 3 mL, Rfl: 1   sulfamethoxazole-trimethoprim (BACTRIM DS) 800-160 MG tablet, Take 1 tablet by mouth 2 (two) times daily., Disp: 6 tablet, Rfl: 0   Vitamin D, Ergocalciferol,  (DRISDOL) 1.25 MG (50000 UNIT) CAPS capsule, Take 50,000 Units by mouth every 7 (seven) days., Disp: , Rfl:    carvedilol (COREG) 6.25 MG tablet, TAKE 1 TABLET BY MOUTH TWICE A DAY WITH FOOD, Disp: 180 tablet, Rfl: 0   carvedilol (COREG) 6.25 MG tablet, TAKE 1 TABLET BY MOUTH TWICE A DAY WITH FOOD, Disp: 180 tablet, Rfl: 0   lisinopril (ZESTRIL) 20 MG tablet, TAKE 1 TABLET BY MOUTH  ONCE DAILY., Disp: 90 tablet, Rfl: 1   metFORMIN (GLUCOPHAGE-XR) 500 MG 24 hr tablet, TAKE 2 TABLETS BY MOUTH WITH A MEAL TWICE A DAY FOR 30 DAYS., Disp: 120 tablet, Rfl: 0   metFORMIN (GLUCOPHAGE-XR) 500 MG 24 hr tablet, TAKE 2 TABLETS BY MOUTH WITH BREAKFAST AND 2 WITH DINNER., Disp: 240 tablet, Rfl: 1   Objective:   Vitals:   07/26/23 1429  BP: 132/84  Pulse: 81  SpO2: 96%    Physical Exam Vitals reviewed.  Constitutional:      Appearance: Normal appearance.  HENT:     Head: Atraumatic.  Cardiovascular:     Rate and Rhythm: Normal rate.     Pulses: Normal pulses.  Pulmonary:     Effort: Pulmonary effort is normal.  Abdominal:     General: There is no distension.     Palpations: Abdomen is soft.  Musculoskeletal:        General: No swelling or deformity.  Skin:    General: Skin is warm.     Capillary Refill: Capillary refill takes less than 2 seconds.     Coloration: Skin is not jaundiced.     Findings: No bruising.  Neurological:     Mental Status: She is alert and oriented to person, place, and time.  Psychiatric:        Mood and Affect: Mood normal.        Behavior: Behavior normal.        Thought Content: Thought content normal.        Judgment: Judgment normal.     Assessment & Plan:  Morbid obesity (HCC) - Plan: Amb Ref to Medical Weight Management  Panniculitis  The patient is a good candidate for panniculectomy.  Because she is dedicated to losing more weight I would like to refer her to the healthy weight and wellness.  I think that if she can lose some more weight she is  could have a really nice result.  The patient is in agreement.  Will set up a televisit for early next year.  If anything changes she can let us know.  Pictures were obtained of the patient and placed in the chart with the patient's or guardian's permission.   Alena Bills [place], DO

## 2023-08-09 ENCOUNTER — Other Ambulatory Visit (HOSPITAL_COMMUNITY): Payer: Self-pay

## 2023-08-12 ENCOUNTER — Other Ambulatory Visit (HOSPITAL_COMMUNITY): Payer: Self-pay

## 2023-08-15 ENCOUNTER — Other Ambulatory Visit (HOSPITAL_COMMUNITY): Payer: Self-pay

## 2023-08-15 MED ORDER — METFORMIN HCL ER 500 MG PO TB24
1000.0000 mg | ORAL_TABLET | Freq: Two times a day (BID) | ORAL | 0 refills | Status: DC
  Filled 2023-08-15 – 2023-08-27 (×2): qty 360, 90d supply, fill #0

## 2023-08-25 DIAGNOSIS — E785 Hyperlipidemia, unspecified: Secondary | ICD-10-CM | POA: Diagnosis not present

## 2023-08-26 DIAGNOSIS — M25562 Pain in left knee: Secondary | ICD-10-CM | POA: Diagnosis not present

## 2023-08-26 DIAGNOSIS — R3915 Urgency of urination: Secondary | ICD-10-CM | POA: Diagnosis not present

## 2023-08-27 ENCOUNTER — Other Ambulatory Visit (HOSPITAL_COMMUNITY): Payer: Self-pay

## 2023-08-27 MED ORDER — NAPROXEN 500 MG PO TABS
500.0000 mg | ORAL_TABLET | Freq: Two times a day (BID) | ORAL | 0 refills | Status: DC
  Filled 2023-08-27: qty 14, 7d supply, fill #0

## 2023-08-30 ENCOUNTER — Other Ambulatory Visit (HOSPITAL_COMMUNITY): Payer: Self-pay

## 2023-08-30 MED ORDER — NITROFURANTOIN MONOHYD MACRO 100 MG PO CAPS
100.0000 mg | ORAL_CAPSULE | Freq: Two times a day (BID) | ORAL | 0 refills | Status: AC
  Filled 2023-08-30: qty 14, 7d supply, fill #0

## 2023-08-31 ENCOUNTER — Other Ambulatory Visit (HOSPITAL_COMMUNITY): Payer: Self-pay

## 2023-09-06 ENCOUNTER — Other Ambulatory Visit (HOSPITAL_COMMUNITY): Payer: Self-pay

## 2023-09-07 ENCOUNTER — Other Ambulatory Visit (HOSPITAL_COMMUNITY): Payer: Self-pay

## 2023-09-07 MED ORDER — CARVEDILOL 6.25 MG PO TABS
6.2500 mg | ORAL_TABLET | Freq: Two times a day (BID) | ORAL | 2 refills | Status: DC
  Filled 2023-09-07: qty 180, 90d supply, fill #0
  Filled 2023-10-24 – 2023-12-08 (×2): qty 180, 90d supply, fill #1
  Filled 2024-03-05: qty 180, 90d supply, fill #2

## 2023-09-07 MED ORDER — PRAVASTATIN SODIUM 40 MG PO TABS
40.0000 mg | ORAL_TABLET | Freq: Every day | ORAL | 2 refills | Status: DC
  Filled 2023-09-07: qty 90, 90d supply, fill #0
  Filled 2023-12-08: qty 90, 90d supply, fill #1
  Filled 2024-03-05: qty 90, 90d supply, fill #2

## 2023-09-09 ENCOUNTER — Other Ambulatory Visit (HOSPITAL_COMMUNITY): Payer: Self-pay

## 2023-09-16 ENCOUNTER — Other Ambulatory Visit (HOSPITAL_COMMUNITY): Payer: Self-pay

## 2023-09-17 ENCOUNTER — Other Ambulatory Visit (HOSPITAL_COMMUNITY): Payer: Self-pay

## 2023-09-19 ENCOUNTER — Other Ambulatory Visit (HOSPITAL_COMMUNITY): Payer: Self-pay

## 2023-09-21 DIAGNOSIS — R051 Acute cough: Secondary | ICD-10-CM | POA: Diagnosis not present

## 2023-09-21 DIAGNOSIS — Z8709 Personal history of other diseases of the respiratory system: Secondary | ICD-10-CM | POA: Diagnosis not present

## 2023-09-21 DIAGNOSIS — J209 Acute bronchitis, unspecified: Secondary | ICD-10-CM | POA: Diagnosis not present

## 2023-09-21 DIAGNOSIS — R062 Wheezing: Secondary | ICD-10-CM | POA: Diagnosis not present

## 2023-09-22 ENCOUNTER — Other Ambulatory Visit (HOSPITAL_COMMUNITY): Payer: Self-pay

## 2023-09-22 MED ORDER — PREDNISONE 20 MG PO TABS
40.0000 mg | ORAL_TABLET | Freq: Every day | ORAL | 0 refills | Status: DC
  Filled 2023-09-22: qty 10, 5d supply, fill #0

## 2023-09-22 MED ORDER — DOXYCYCLINE HYCLATE 100 MG PO TABS
100.0000 mg | ORAL_TABLET | Freq: Two times a day (BID) | ORAL | 0 refills | Status: DC
  Filled 2023-09-22: qty 20, 10d supply, fill #0

## 2023-09-23 ENCOUNTER — Other Ambulatory Visit (HOSPITAL_COMMUNITY): Payer: Self-pay

## 2023-10-17 ENCOUNTER — Other Ambulatory Visit (HOSPITAL_COMMUNITY): Payer: Self-pay

## 2023-10-18 ENCOUNTER — Other Ambulatory Visit (HOSPITAL_COMMUNITY): Payer: Self-pay

## 2023-10-18 MED ORDER — LISINOPRIL 20 MG PO TABS
20.0000 mg | ORAL_TABLET | Freq: Every day | ORAL | 0 refills | Status: DC
  Filled 2023-10-18: qty 90, 90d supply, fill #0

## 2023-10-23 ENCOUNTER — Ambulatory Visit (HOSPITAL_COMMUNITY)
Admission: EM | Admit: 2023-10-23 | Discharge: 2023-10-23 | Disposition: A | Discharge status: Home / Self Care | Payer: Commercial Managed Care - PPO

## 2023-10-23 ENCOUNTER — Telehealth (HOSPITAL_COMMUNITY): Payer: Self-pay

## 2023-10-23 ENCOUNTER — Encounter (HOSPITAL_COMMUNITY): Payer: Self-pay

## 2023-10-23 DIAGNOSIS — J111 Influenza due to unidentified influenza virus with other respiratory manifestations: Secondary | ICD-10-CM

## 2023-10-23 MED ORDER — ONDANSETRON 4 MG PO TBDP: 4.0000 mg | ORAL_TABLET | Freq: Three times a day (TID) | ORAL | 0 refills | Status: AC | PRN

## 2023-10-23 MED ORDER — ONDANSETRON 4 MG PO TBDP
ORAL_TABLET | ORAL | Status: AC
  Filled 2023-10-23: qty 1

## 2023-10-23 MED ORDER — DEXAMETHASONE SODIUM PHOSPHATE 10 MG/ML IJ SOLN
INTRAMUSCULAR | Status: AC
  Filled 2023-10-23: qty 1

## 2023-10-23 MED ORDER — OSELTAMIVIR PHOSPHATE 75 MG PO CAPS
75.0000 mg | ORAL_CAPSULE | Freq: Two times a day (BID) | ORAL | 0 refills | Status: AC
  Filled 2023-10-23: qty 10, 5d supply, fill #0

## 2023-10-23 MED ORDER — OSELTAMIVIR PHOSPHATE 75 MG PO CAPS: 75.0000 mg | ORAL_CAPSULE | Freq: Two times a day (BID) | ORAL | 0 refills | Status: DC

## 2023-10-23 MED ORDER — ONDANSETRON HCL 4 MG/2ML IJ SOLN
INTRAMUSCULAR | Status: AC
  Filled 2023-10-23: qty 2

## 2023-10-23 MED ORDER — ONDANSETRON HCL 4 MG/2ML IJ SOLN
4.0000 mg | Freq: Once | INTRAMUSCULAR | Status: AC
  Administered 2023-10-23: 4 mg via INTRAMUSCULAR

## 2023-10-23 MED ORDER — DEXAMETHASONE SODIUM PHOSPHATE 10 MG/ML IJ SOLN
10.0000 mg | Freq: Once | INTRAMUSCULAR | Status: AC
  Administered 2023-10-23: 10 mg via INTRAMUSCULAR

## 2023-10-23 MED ORDER — PROMETHAZINE-DM 6.25-15 MG/5ML PO SYRP: 5.0000 mL | ORAL_SOLUTION | Freq: Four times a day (QID) | ORAL | 0 refills | Status: AC | PRN

## 2023-10-23 NOTE — Telephone Encounter (Signed)
Informed by the front desk: "Patient calling in. Can you have rx switched to [facility] please. Thanks Tamiflu. They don't have the rx, so she is going try the op pharm."   Medication sent in to preferred pharmacy per protocol.

## 2023-10-23 NOTE — ED Triage Notes (Signed)
Patient here today with c/o cough, nasal congestion, chills, sweats, body aches, headache, SOB, wheeze, nausea, and vomiting since yesterday morning. She has been taking Theraflu and acetaminophen with little relief. Last dose of at 7 am. Her husband has also been sick.

## 2023-10-23 NOTE — ED Provider Notes (Signed)
[facility]    CSN: [id number] Arrival date & time: 10/23/23  1003      History   Chief Complaint Chief Complaint  Patient presents with   Cough    HPI Natalie Suarez is a 58 y.o. female.   Patient presents to clinic complaining of headache, body aches, cough, nasal congestion, rhinorrhea, hot and cold chills, fever, shortness of breath, wheezing and 1 episode of posttussive emesis.  Symptoms started yesterday morning.  She has been taken TheraFlu and Tylenol with a little relief.  Last dose of Tylenol was at 7 AM after a temperature of 101.  Husband has also been sick since Friday with similar symptoms.  No abdominal pain, no current nausea.   The history is provided by the patient and medical records.  Cough   Past Medical History:  Diagnosis Date   Allergy    Hyperlipidemia    Hypertension     Patient Active Problem List   Diagnosis Date Noted   Panniculitis 07/26/2023   Morbid obesity (HCC) 02/09/2013    Past Surgical History:  Procedure Laterality Date   ABDOMINAL HYSTERECTOMY  2000   total   BRAIN SURGERY  2004   reduction   CESAREAN SECTION  1989   ear surgery  1980   TUBAL LIGATION  1999    OB History   No obstetric history on file.      Home Medications    Prior to Admission medications   Medication Sig Start Date End Date Taking? Authorizing Provider  oseltamivir (TAMIFLU) 75 MG capsule Take 1 capsule (75 mg total) by mouth every 12 (twelve) hours. 10/23/23  Yes Rinaldo Ratel, [place], FNP  promethazine-dextromethorphan (PROMETHAZINE-DM) 6.25-15 MG/5ML syrup Take 5 mLs by mouth 4 (four) times daily as needed for cough. 10/23/23  Yes Rinaldo Ratel, [place], FNP  tirzepatide [facility]) 2.5 MG/0.5ML Pen Inject 2.5 mg into the skin once a week. Patient not taking: Reported on 10/23/2023 10/21/23   [provider]  albuterol (VENTOLIN HFA) 108 (90 Base) MCG/ACT inhaler Inhale 1 puff into the lungs every 4 (four) hours as needed. 02/25/23      carvedilol (COREG) 6.25 MG tablet Take 1 tablet (6.25 mg total) by mouth 2 (two) times daily. 09/07/23     cholecalciferol (VITAMIN D3) 25 MCG (1000 UT) tablet Take 1,000 Units by mouth daily.    [provider]  cyclobenzaprine (FLEXERIL) 5 MG tablet Take 1 tablet (5 mg total) by mouth 3 (three) times daily as needed. 07/05/22     glucose blood (FREESTYLE LITE) test strip Use to check blood sugar once daily as directed 06/23/22     Lancets (FREESTYLE) lancets Use once daily as directed 06/23/22     lisinopril (ZESTRIL) 20 MG tablet Take 1 tablet (20 mg total) by mouth daily. 10/18/23     metFORMIN (GLUCOPHAGE-XR) 500 MG 24 hr tablet Take 2 tablets (1,000 mg total) by mouth 2 (two) times daily with a meal. 08/15/23     pravastatin (PRAVACHOL) 40 MG tablet Take 1 tablet (40 mg total) by mouth daily. 09/07/23       Family History Family History  Problem Relation Age of Onset   Heart disease Mother    Diabetes Mother    Hyperlipidemia Mother    Heart disease Sister    Hyperlipidemia Sister    Diabetes Sister    Cancer Maternal Uncle        lung   Cancer Maternal Uncle  liver,lymph nodes    Social History Social History   Tobacco Use   Smoking status: Former    Current packs/day: 0.00    Types: Cigarettes    Quit date: 02/10/1996    Years since quitting: 27.7   Smokeless tobacco: Never  Vaping Use   Vaping status: Never Used  Substance Use Topics   Alcohol use: Yes    Comment: rare   Drug use: No     Allergies   Crestor [rosuvastatin], Lipitor [atorvastatin], and Latex   Review of Systems Review of Systems  Per HPI  Physical Exam Triage Vital Signs ED Triage Vitals  Encounter Vitals Group     BP 10/23/23 1033 133/69     Systolic BP Percentile --      Diastolic BP Percentile --      Pulse Rate 10/23/23 1033 100     Resp 10/23/23 1033 16     Temp 10/23/23 1033 98.8 F (37.1 C)     Temp Source 10/23/23 1033 Oral     SpO2 10/23/23 1033 95 %      Weight 10/23/23 1028 217 lb (98.4 kg)     Height 10/23/23 1028 5\' 2"  (1.575 m)     Head Circumference --      Peak Flow --      Pain Score 10/23/23 1029 8     Pain Loc --      Pain Education --      Exclude from Growth Chart --    No data found.  Updated Vital Signs BP 133/69 (BP Location: Left Arm)   Pulse 100   Temp 98.8 F (37.1 C) (Oral)   Resp 16   Ht 5\' 2"  (1.575 m)   Wt 217 lb (98.4 kg)   SpO2 95%   BMI 39.69 kg/m   Visual Acuity Right Eye Distance:   Left Eye Distance:   Bilateral Distance:    Right Eye Near:   Left Eye Near:    Bilateral Near:     Physical Exam Vitals and nursing note reviewed.  Constitutional:      Appearance: Normal appearance.  HENT:     Head: Normocephalic and atraumatic.     Right Ear: External ear normal.     Left Ear: External ear normal.     Nose: Congestion and rhinorrhea present.     Mouth/Throat:     Mouth: Mucous membranes are moist.     Pharynx: Posterior oropharyngeal erythema present.  Eyes:     Conjunctiva/sclera: Conjunctivae normal.  Cardiovascular:     Rate and Rhythm: Normal rate and regular rhythm.     Heart sounds: Normal heart sounds. No murmur heard. Pulmonary:     Effort: No respiratory distress.     Breath sounds: Normal breath sounds.  Musculoskeletal:        General: Normal range of motion.  Skin:    General: Skin is warm and dry.  Neurological:     General: No focal deficit present.     Mental Status: She is alert and oriented to person, place, and time.  Psychiatric:        Mood and Affect: Mood normal.        Behavior: Behavior normal.      UC Treatments / Results  Labs (all labs ordered are listed, but only abnormal results are displayed) Labs Reviewed - No data to display  EKG   Radiology No results found.  Procedures Procedures (including critical care time)  Medications  Ordered in UC Medications  dexamethasone (DECADRON) injection 10 mg (has no administration in time range)     Initial Impression / Assessment and Plan / UC Course  I have reviewed the triage vital signs and the nursing notes.  Pertinent labs & imaging results that were available during my care of the patient were reviewed by me and considered in my medical decision making (see chart for details).  Vitals and triage reviewed, patient is hemodynamically stable.  Lungs are vesicular, heart with regular rate and rhythm.  Congestion, rhinorrhea and postnasal drip present on physical exam.  Symptoms consistent with viral URI.  Patient does have history of asthma, given one-time IM steroid dose in clinic to help with inflammation and to prevent any asthma exacerbation.  Symptomatic management for viral illness discussed, symptoms consistent with influenza and no POC testing available in clinic at this time.  Will start on Tamiflu.  Plan of care, follow-up care return precautions given, no questions at this time.  Work note provided.     Final Clinical Impressions(s) / UC Diagnoses   Final diagnoses:  Influenza-like illness     Discharge Instructions      Take the Tamiflu as prescribed.  You can use the cough medicine up to 4 times daily, do not drink alcohol or drive on this medication as it may cause sedation.  For fever, headache, body aches and chills alternate between and her milligrams of ibuprofen and 500 mg of Tylenol every 4-6 hours.  1200 mg of Mucinex daily can help break up any congestion, sleeping with a humidifier may help with sore throat and break up secretions as well.  Your symptoms should improve over the next 5 to 7 days, if no improvement or any changes follow-up with your primary care provider or return to clinic for re-evaluation.    ED Prescriptions     Medication Sig Dispense Auth. Provider   oseltamivir (TAMIFLU) 75 MG capsule Take 1 capsule (75 mg total) by mouth every 12 (twelve) hours. 10 capsule Heyden Jaber, [place], [place]   promethazine-dextromethorphan  (PROMETHAZINE-DM) 6.25-15 MG/5ML syrup Take 5 mLs by mouth 4 (four) times daily as needed for cough. 118 mL Nissi Doffing, [place], [place]      PDMP not reviewed this encounter.   Simra Fiebig, [place] N, [place] 10/23/23 1054

## 2023-10-23 NOTE — Discharge Instructions (Addendum)
Take the Tamiflu as prescribed.  You can use the cough medicine up to 4 times daily, do not drink alcohol or drive on this medication as it may cause sedation.  For fever, headache, body aches and chills alternate between and her milligrams of ibuprofen and 500 mg of Tylenol every 4-6 hours.  1200 mg of Mucinex daily can help break up any congestion, sleeping with a humidifier may help with sore throat and break up secretions as well.  Your symptoms should improve over the next 5 to 7 days, if no improvement or any changes follow-up with your primary care provider or return to clinic for re-evaluation.

## 2023-10-24 ENCOUNTER — Other Ambulatory Visit (HOSPITAL_COMMUNITY): Payer: Self-pay

## 2023-10-24 MED ORDER — MOUNJARO 2.5 MG/0.5ML ~~LOC~~ SOAJ
2.5000 mg | SUBCUTANEOUS | 0 refills | Status: AC
  Filled 2023-10-24 – 2024-01-04 (×2): qty 2, 28d supply, fill #0

## 2023-10-27 ENCOUNTER — Encounter (HOSPITAL_COMMUNITY): Payer: Self-pay

## 2023-10-27 ENCOUNTER — Ambulatory Visit (INDEPENDENT_AMBULATORY_CARE_PROVIDER_SITE_OTHER): Payer: 59 | Admitting: Adult Health

## 2023-10-27 ENCOUNTER — Ambulatory Visit (HOSPITAL_COMMUNITY)
Admission: EM | Admit: 2023-10-27 | Discharge: 2023-10-27 | Disposition: A | Discharge status: Home / Self Care | Payer: Commercial Managed Care - PPO | Attending: Family Medicine | Admitting: Family Medicine

## 2023-10-27 DIAGNOSIS — R112 Nausea with vomiting, unspecified: Secondary | ICD-10-CM

## 2023-10-27 DIAGNOSIS — R197 Diarrhea, unspecified: Secondary | ICD-10-CM | POA: Diagnosis not present

## 2023-10-27 MED ORDER — ONDANSETRON HCL 4 MG/2ML IJ SOLN
INTRAMUSCULAR | Status: AC
  Filled 2023-10-27: qty 2

## 2023-10-27 MED ORDER — ONDANSETRON HCL 4 MG/2ML IJ SOLN
4.0000 mg | Freq: Once | INTRAMUSCULAR | Status: AC
  Administered 2023-10-27: 4 mg via INTRAMUSCULAR

## 2023-10-27 NOTE — ED Provider Notes (Signed)
 [facility]    CSN: [id number] Arrival date & time: 10/27/23  9076      History   Chief Complaint Chief Complaint  Patient presents with   Emesis   Fever    HPI Natalie Suarez is a 58 y.o. female.    Emesis Associated symptoms: diarrhea and fever   Fever Associated symptoms: diarrhea and vomiting    Patient is here for not feeling well.  She started with fever last night.  Then with vomiting and diarrhea, lasted until 2am this morning.   She last had vomiting about 4am.  She was seen over the weekend for flu like symptoms.  That improved, but then these symptoms started today.  She took zofran  over night but feels that did not help.  This morning she started with fever again, and felt nauseated, so she came it.        Past Medical History:  Diagnosis Date   Allergy    Hyperlipidemia    Hypertension     Patient Active Problem List   Diagnosis Date Noted   Panniculitis 07/26/2023   Morbid obesity (HCC) 02/09/2013    Past Surgical History:  Procedure Laterality Date   ABDOMINAL HYSTERECTOMY  2000   total   BRAIN SURGERY  2004   reduction   CESAREAN SECTION  1989   ear surgery  1980   TUBAL LIGATION  1999    OB History   No obstetric history on file.      Home Medications    Prior to Admission medications   Medication Sig Start Date End Date Taking? Authorizing Provider  albuterol  (VENTOLIN  HFA) 108 (90 Base) MCG/ACT inhaler Inhale 1 puff into the lungs every 4 (four) hours as needed. 02/25/23  Yes   carvedilol  (COREG ) 6.25 MG tablet Take 1 tablet (6.25 mg total) by mouth 2 (two) times daily. 09/07/23  Yes   cholecalciferol (VITAMIN D3) 25 MCG (1000 UT) tablet Take 1,000 Units by mouth daily.   Yes [provider]  cyclobenzaprine  (FLEXERIL ) 5 MG tablet Take 1 tablet (5 mg total) by mouth 3 (three) times daily as needed. 07/05/22  Yes   glucose blood (FREESTYLE LITE) test strip Use to check blood sugar once daily as directed  06/23/22  Yes   Lancets (FREESTYLE) lancets Use once daily as directed 06/23/22  Yes   lisinopril  (ZESTRIL ) 20 MG tablet Take 1 tablet (20 mg total) by mouth daily. 10/18/23  Yes   metFORMIN  (GLUCOPHAGE -XR) 500 MG 24 hr tablet Take 2 tablets (1,000 mg total) by mouth 2 (two) times daily with a meal. 08/15/23  Yes   ondansetron  (ZOFRAN -ODT) 4 MG disintegrating tablet Take 1 tablet (4 mg total) by mouth every 8 (eight) hours as needed for nausea or vomiting. 10/23/23  Yes Garrison, [place]  N, FNP  oseltamivir  (TAMIFLU ) 75 MG capsule Take 1 capsule (75 mg total) by mouth every 12 (twelve) hours. 10/23/23  Yes Garrison, [place]  N, FNP  pravastatin  (PRAVACHOL ) 40 MG tablet Take 1 tablet (40 mg total) by mouth daily. 09/07/23  Yes   promethazine -dextromethorphan (PROMETHAZINE -DM) 6.25-15 MG/5ML syrup Take 5 mLs by mouth 4 (four) times daily as needed for cough. 10/23/23  Yes Garrison, [place]  N, FNP  tirzepatide  (MOUNJARO ) 2.5 MG/0.5ML Pen Inject 2.5 mg into the skin once a week. 10/21/23  Yes [provider]  tirzepatide  (MOUNJARO ) 2.5 MG/0.5ML Pen Inject 2.5 mg into the skin once a week. 10/24/23  Yes     Family History Family History  Problem Relation Age of Onset   Heart disease Mother    Diabetes Mother    Hyperlipidemia Mother    Heart disease Sister    Hyperlipidemia Sister    Diabetes Sister    Cancer Maternal Uncle        lung   Cancer Maternal Uncle        liver,lymph nodes    Social History Social History   Tobacco Use   Smoking status: Former    Current packs/day: 0.00    Types: Cigarettes    Quit date: 02/10/1996    Years since quitting: 27.7   Smokeless tobacco: Never  Vaping Use   Vaping status: Never Used  Substance Use Topics   Alcohol use: Yes    Comment: rare   Drug use: No     Allergies   Crestor [rosuvastatin], Lipitor [atorvastatin], and Latex   Review of Systems Review of Systems  Constitutional:  Positive for fever.  HENT: Negative.    Respiratory:  Negative.    Cardiovascular: Negative.   Gastrointestinal:  Positive for diarrhea and vomiting.  Genitourinary: Negative.   Musculoskeletal: Negative.   Skin: Negative.   Psychiatric/Behavioral: Negative.       Physical Exam Triage Vital Signs ED Triage Vitals  Encounter Vitals Group     BP 10/27/23 1240 (!) 148/84     Systolic BP Percentile --      Diastolic BP Percentile --      Pulse Rate 10/27/23 1240 (!) 102     Resp 10/27/23 1240 18     Temp 10/27/23 1240 98.4 F (36.9 C)     Temp Source 10/27/23 1240 Oral     SpO2 10/27/23 1240 96 %     Weight 10/27/23 1240 217 lb (98.4 kg)     Height 10/27/23 1240 5' 2 (1.575 m)     Head Circumference --      Peak Flow --      Pain Score 10/27/23 1239 0     Pain Loc --      Pain Education --      Exclude from Growth Chart --    No data found.  Updated Vital Signs BP (!) 148/84 (BP Location: Right Arm)   Pulse (!) 102   Temp 98.4 F (36.9 C) (Oral)   Resp 18   Ht 5' 2 (1.575 m)   Wt 98.4 kg   SpO2 96%   BMI 39.69 kg/m   Visual Acuity Right Eye Distance:   Left Eye Distance:   Bilateral Distance:    Right Eye Near:   Left Eye Near:    Bilateral Near:     Physical Exam Constitutional:      General: She is not in acute distress.    Appearance: Normal appearance. She is normal weight. She is ill-appearing. She is not toxic-appearing.  HENT:     Nose: Nose normal.     Mouth/Throat:     Mouth: Mucous membranes are moist.  Cardiovascular:     Rate and Rhythm: Normal rate and regular rhythm.  Pulmonary:     Effort: Pulmonary effort is normal.     Breath sounds: Normal breath sounds. No wheezing or rhonchi.  Abdominal:     Palpations: Abdomen is soft.     Tenderness: There is abdominal tenderness.     Comments: Mild tenderness across the upper abdomen  Musculoskeletal:     Cervical back: Normal range of motion and neck supple. No tenderness.  Skin:  General: Skin is warm.  Neurological:     General: No  focal deficit present.     Mental Status: She is alert.  Psychiatric:        Mood and Affect: Mood normal.      UC Treatments / Results  Labs (all labs ordered are listed, but only abnormal results are displayed) Labs Reviewed - No data to display  EKG   Radiology No results found.  Procedures Procedures (including critical care time)  Medications Ordered in UC Medications  ondansetron  (ZOFRAN ) injection 4 mg (has no administration in time range)    Initial Impression / Assessment and Plan / UC Course  I have reviewed the triage vital signs and the nursing notes.  Pertinent labs & imaging results that were available during my care of the patient were reviewed by me and considered in my medical decision making (see chart for details).    Final diagnoses:  Nausea vomiting and diarrhea     Discharge Instructions      You were seen today for nausea, vomiting and diarrhea.  This appears viral.  I have given you a shot of zofran  to help with nausea and vomiting.   You may take the zofran  you have at home going forward with a small sip of liquid such as gingerale.   If you continue with vomiting despite medication, or feel that you are getting dehydrated, then please go to the ER for treatment.     ED Prescriptions   None    PDMP not reviewed this encounter.   Darral Longs, MD 10/27/23 1304

## 2023-10-27 NOTE — ED Triage Notes (Signed)
 Vomiting, Fever X1 day. Patient was seen this past weekend for flu like symptoms. Received some shots in office then nausea and cough medication to take home. States has not been helping. Took tylenol and motrin this morning.   Having emesis and explosive diarrhea.

## 2023-10-27 NOTE — Discharge Instructions (Addendum)
 You were seen today for nausea, vomiting and diarrhea.  This appears viral.  I have given you a shot of zofran  to help with nausea and vomiting.   You may take the zofran  you have at home going forward with a small sip of liquid such as gingerale.   If you continue with vomiting despite medication, or feel that you are getting dehydrated, then please go to the ER for treatment.

## 2023-10-28 ENCOUNTER — Other Ambulatory Visit: Payer: Self-pay | Admitting: Family Medicine

## 2023-10-28 DIAGNOSIS — M8580 Other specified disorders of bone density and structure, unspecified site: Secondary | ICD-10-CM

## 2023-11-04 ENCOUNTER — Other Ambulatory Visit (HOSPITAL_COMMUNITY): Payer: Self-pay

## 2023-11-04 DIAGNOSIS — E1169 Type 2 diabetes mellitus with other specified complication: Secondary | ICD-10-CM | POA: Diagnosis not present

## 2023-11-04 DIAGNOSIS — Z6841 Body Mass Index (BMI) 40.0 and over, adult: Secondary | ICD-10-CM | POA: Diagnosis not present

## 2023-11-04 DIAGNOSIS — I10 Essential (primary) hypertension: Secondary | ICD-10-CM | POA: Diagnosis not present

## 2023-11-04 DIAGNOSIS — B354 Tinea corporis: Secondary | ICD-10-CM | POA: Diagnosis not present

## 2023-11-04 DIAGNOSIS — E559 Vitamin D deficiency, unspecified: Secondary | ICD-10-CM | POA: Diagnosis not present

## 2023-11-04 DIAGNOSIS — E785 Hyperlipidemia, unspecified: Secondary | ICD-10-CM | POA: Diagnosis not present

## 2023-11-04 MED ORDER — MOUNJARO 2.5 MG/0.5ML ~~LOC~~ SOAJ
2.5000 mg | SUBCUTANEOUS | 0 refills | Status: DC
  Filled 2023-11-04: qty 2, 28d supply, fill #0

## 2023-11-04 MED ORDER — ONDANSETRON HCL 4 MG PO TABS
4.0000 mg | ORAL_TABLET | Freq: Every day | ORAL | 0 refills | Status: AC
  Filled 2023-11-04: qty 30, 30d supply, fill #0

## 2023-11-04 MED ORDER — CLOTRIMAZOLE-BETAMETHASONE 1-0.05 % EX CREA
1.0000 | TOPICAL_CREAM | Freq: Two times a day (BID) | CUTANEOUS | 1 refills | Status: DC
  Filled 2023-11-04: qty 15, 14d supply, fill #0
  Filled 2024-01-19: qty 15, 14d supply, fill #1

## 2023-11-09 ENCOUNTER — Other Ambulatory Visit (HOSPITAL_COMMUNITY): Payer: Self-pay

## 2023-11-11 ENCOUNTER — Other Ambulatory Visit (HOSPITAL_COMMUNITY): Payer: Self-pay

## 2023-11-22 ENCOUNTER — Ambulatory Visit (INDEPENDENT_AMBULATORY_CARE_PROVIDER_SITE_OTHER): Payer: 59 | Admitting: Plastic Surgery

## 2023-11-22 DIAGNOSIS — M793 Panniculitis, unspecified: Secondary | ICD-10-CM

## 2023-11-22 NOTE — Progress Notes (Signed)
   Subjective:    Patient ID: Natalie Suarez, female    DOB: 06/17/1966, 58 y.o.   MRN: [id number]  The patient is a 58 year old female joining me by phone for further discussion about her abdomen.  She was seen in November.  She had concerns about panniculitis.  She is 5 feet 2 inches tall and weighed 221 pounds at that visit.  Her last hemoglobin A1c at that time was 6.4.  Her past medical history is positive for prediabetes, hypertension, anxiety and hyperlipidemia.  She suffers from back pain and gout and has been dealing with the panniculitis for extended period of time.  It is not improved with weight loss.  She has tried creams and lotions without any help for her skin breakdown.  The patient's weight has gone down about 5 pounds since the past couple of weeks when she started [facility].    Review of Systems  Constitutional: Negative.   Eyes: Negative.   Respiratory: Negative.    Cardiovascular: Negative.   Endocrine: Negative.        Objective:   Physical Exam      Assessment & Plan:     ICD-10-CM   1. Panniculitis  M79.3         I connected with  Natalie Suarez on 11/22/23 by phone and verified that I am speaking with the correct person using two identifiers.   I discussed the limitations of evaluation and management by telemedicine. The patient expressed understanding and agreed to proceed.  She is going to continue with the [facility] and let us know how she does.  The patient is at home and I was at the office.  We spent 5 minutes in discussion.  The patient will call back when she is ready for further evaluation

## 2023-11-23 ENCOUNTER — Ambulatory Visit (INDEPENDENT_AMBULATORY_CARE_PROVIDER_SITE_OTHER): Payer: Commercial Managed Care - PPO | Admitting: Adult Health

## 2023-11-23 ENCOUNTER — Encounter (INDEPENDENT_AMBULATORY_CARE_PROVIDER_SITE_OTHER): Payer: Self-pay | Admitting: Adult Health

## 2023-11-23 VITALS — BP 114/70 | HR 84 | Temp 98.2°F | Ht 61.0 in | Wt 214.0 lb

## 2023-11-23 DIAGNOSIS — Z7985 Long-term (current) use of injectable non-insulin antidiabetic drugs: Secondary | ICD-10-CM

## 2023-11-23 DIAGNOSIS — Z6841 Body Mass Index (BMI) 40.0 and over, adult: Secondary | ICD-10-CM | POA: Diagnosis not present

## 2023-11-23 DIAGNOSIS — Z0000 Encounter for general adult medical examination without abnormal findings: Secondary | ICD-10-CM

## 2023-11-23 DIAGNOSIS — E1169 Type 2 diabetes mellitus with other specified complication: Secondary | ICD-10-CM

## 2023-11-23 NOTE — Progress Notes (Signed)
 Office: [phone]  /  Fax: [phone]   Initial Visit  Natalie Suarez was seen in clinic today to evaluate for obesity. She is interested in losing weight to improve overall health and reduce the risk of weight related complications. She presents today to review program treatment options, initial physical assessment, and evaluation.     She was referred by: Friend or Family  When asked what else they would like to accomplish? She states: Adopt healthier eating patterns, Improve energy levels and physical activity, Improve existing medical conditions, Reduce number of medications, and Improve quality of life  Weight history: Weight gain since hysterectomy and tobacco cessation, approximately 20 years ago  When asked how has your weight affected you? She states: Contributed to medical problems, Contributed to orthopedic problems or mobility issues, Having fatigue, Having poor endurance, and Problems with eating patterns  Some associated conditions: Hypertension, Hyperlipidemia, and Diabetes  Contributing factors: Family history of obesity, Disruption of circadian rhythm / sleep disordered breathing, Consumption of processed foods, Moderate to high levels of stress, Reduced physical activity, and Eating patterns  Weight promoting medications identified: Beta-blockers  Current nutrition plan: None  Current level of physical activity: Walking 15-30 minutes  Current or previous pharmacotherapy: Other: Currently on loading dose of Mounjaro 2.5mg   Response to medication:  Has lost 6 lbs since starting Mounjaro   Past medical history includes:   Past Medical History:  Diagnosis Date   Allergy    Hyperlipidemia    Hypertension      Objective:   BP 114/70   Pulse 84   Temp 98.2 F (36.8 C)   Ht 5\' 1"  (1.549 m)   Wt 214 lb (97.1 kg)   SpO2 97%   BMI 40.43 kg/m  She was weighed on the bioimpedance scale: Body mass index is 40.43 kg/m.  Peak Weight:246 , Body Fat%:48.9,  Visceral Fat Rating:15, Weight trend over the last 12 months: Decreasing  General:  Alert, oriented and cooperative. Patient is in no acute distress.  Respiratory: Normal respiratory effort, no problems with respiration noted   Gait: able to ambulate independently  Mental Status: Normal mood and affect. Normal behavior. Normal judgment and thought content.   DIAGNOSTIC DATA REVIEWED:  BMET    Component Value Date/Time   NA 139 07/23/2019 1119   K 4.5 07/23/2019 1119   CL 103 07/23/2019 1119   CO2 23 07/23/2019 1119   GLUCOSE 157 (H) 07/23/2019 1119   BUN 14 07/23/2019 1119   CREATININE 0.94 07/23/2019 1119   CREATININE 0.56 02/13/2013 0944   CALCIUM 9.4 07/23/2019 1119   GFRNONAA >60 07/23/2019 1119   GFRAA >60 07/23/2019 1119   No results found for: "HGBA1C" No results found for: "INSULIN" CBC    Component Value Date/Time   WBC 7.6 07/23/2019 1119   RBC 4.82 07/23/2019 1119   HGB 14.9 07/23/2019 1119   HCT 45.8 07/23/2019 1119   PLT 248 07/23/2019 1119   MCV 95.0 07/23/2019 1119   MCH 30.9 07/23/2019 1119   MCHC 32.5 07/23/2019 1119   RDW 11.7 07/23/2019 1119   Iron/TIBC/Ferritin/ %Sat No results found for: "IRON", "TIBC", "FERRITIN", "IRONPCTSAT" Lipid Panel     Component Value Date/Time   CHOL 203 (H) 02/13/2013 0944   TRIG 139 02/13/2013 0944   HDL 47 02/13/2013 0944   CHOLHDL 4.3 02/13/2013 0944   VLDL 28 02/13/2013 0944   LDLCALC 128 (H) 02/13/2013 0944   Hepatic Function Panel     Component Value Date/Time  PROT 7.3 07/23/2019 1338   ALBUMIN 4.3 07/23/2019 1338   AST 31 07/23/2019 1338   ALT 39 07/23/2019 1338   ALKPHOS 56 07/23/2019 1338   BILITOT 0.9 07/23/2019 1338   BILIDIR 0.1 07/23/2019 1338   IBILI 0.8 07/23/2019 1338      Component Value Date/Time   TSH 2.350 07/23/2019 1338   TSH 1.883 02/13/2013 0944     Assessment and Plan:   Healthcare maintenance  Type 2 diabetes mellitus with other specified complication, without  long-term current use of insulin (HCC)  Morbid obesity (HCC), Starting BMI 40.5  ESTABLISH WITH HWW   Obesity Treatment / Action Plan:  Patient will work on garnering support from family and friends to begin weight loss journey. Will work on eliminating or reducing the presence of highly palatable, calorie dense foods in the home. Will complete provided nutritional and psychosocial assessment questionnaire before the next appointment. Will be scheduled for indirect calorimetry to determine resting energy expenditure in a fasting state.  This will allow [place] to create a reduced calorie, high-protein meal plan to promote loss of fat mass while preserving muscle mass. Counseled on the health benefits of losing 5%-15% of total body weight. Was counseled on nutritional approaches to weight loss and benefits of reducing processed foods and consuming plant-based foods and high quality protein as part of nutritional weight management. Was counseled on pharmacotherapy and role as an adjunct in weight management.   Obesity Education Performed Today:  She was weighed on the bioimpedance scale and results were discussed and documented in the synopsis.  We discussed obesity as a disease and the importance of a more detailed evaluation of all the factors contributing to the disease.  We discussed the importance of long term lifestyle changes which include nutrition, exercise and behavioral modifications as well as the importance of customizing this to her specific health and social needs.  We discussed the benefits of reaching a healthier weight to alleviate the symptoms of existing conditions and reduce the risks of the biomechanical, metabolic and psychological effects of obesity.  Natalie Suarez appears to be in the action stage of change and states they are ready to start intensive lifestyle modifications and behavioral modifications.  30 minutes was spent today on this visit including the above  counseling, pre-visit chart review, and post-visit documentation.  Reviewed by clinician on day of visit: allergies, medications, problem list, medical history, surgical history, family history, social history, and previous encounter notes pertinent to obesity diagnosis.  Takela Varden d. Jamae Tison, NP-C

## 2023-11-26 ENCOUNTER — Other Ambulatory Visit (HOSPITAL_COMMUNITY): Payer: Self-pay

## 2023-11-29 ENCOUNTER — Other Ambulatory Visit (HOSPITAL_COMMUNITY): Payer: Self-pay

## 2023-11-30 ENCOUNTER — Other Ambulatory Visit (HOSPITAL_COMMUNITY): Payer: Self-pay

## 2023-11-30 MED ORDER — MOUNJARO 2.5 MG/0.5ML ~~LOC~~ SOAJ
2.5000 mg | SUBCUTANEOUS | 0 refills | Status: AC
Start: 2023-11-30 — End: ?
  Filled 2023-11-30 – 2023-12-05 (×2): qty 2, 28d supply, fill #0

## 2023-12-05 ENCOUNTER — Other Ambulatory Visit (HOSPITAL_COMMUNITY): Payer: Self-pay

## 2023-12-06 ENCOUNTER — Other Ambulatory Visit (HOSPITAL_COMMUNITY): Payer: Self-pay

## 2023-12-07 ENCOUNTER — Other Ambulatory Visit (HOSPITAL_COMMUNITY): Payer: Self-pay

## 2023-12-30 ENCOUNTER — Other Ambulatory Visit (HOSPITAL_COMMUNITY): Payer: Self-pay

## 2024-01-03 ENCOUNTER — Other Ambulatory Visit (HOSPITAL_COMMUNITY): Payer: Self-pay

## 2024-01-03 MED ORDER — METFORMIN HCL ER 500 MG PO TB24
1000.0000 mg | ORAL_TABLET | Freq: Two times a day (BID) | ORAL | 0 refills | Status: DC
  Filled 2024-01-03: qty 360, 90d supply, fill #0

## 2024-01-04 ENCOUNTER — Other Ambulatory Visit (HOSPITAL_COMMUNITY): Payer: Self-pay

## 2024-01-16 ENCOUNTER — Other Ambulatory Visit (HOSPITAL_COMMUNITY): Payer: Self-pay

## 2024-01-17 ENCOUNTER — Other Ambulatory Visit (HOSPITAL_COMMUNITY): Payer: Self-pay

## 2024-01-17 MED ORDER — LISINOPRIL 20 MG PO TABS
20.0000 mg | ORAL_TABLET | Freq: Every day | ORAL | 0 refills | Status: DC
  Filled 2024-01-17: qty 90, 90d supply, fill #0

## 2024-01-19 ENCOUNTER — Other Ambulatory Visit (HOSPITAL_COMMUNITY): Payer: Self-pay

## 2024-01-20 ENCOUNTER — Other Ambulatory Visit (HOSPITAL_COMMUNITY): Payer: Self-pay

## 2024-01-23 ENCOUNTER — Other Ambulatory Visit (HOSPITAL_COMMUNITY): Payer: Self-pay

## 2024-01-23 MED ORDER — METFORMIN HCL ER 500 MG PO TB24
1000.0000 mg | ORAL_TABLET | Freq: Two times a day (BID) | ORAL | 0 refills | Status: DC
  Filled 2024-01-23 – 2024-03-29 (×2): qty 360, 90d supply, fill #0

## 2024-02-01 ENCOUNTER — Other Ambulatory Visit (HOSPITAL_COMMUNITY): Payer: Self-pay

## 2024-02-01 DIAGNOSIS — Z6841 Body Mass Index (BMI) 40.0 and over, adult: Secondary | ICD-10-CM | POA: Diagnosis not present

## 2024-02-01 DIAGNOSIS — M25511 Pain in right shoulder: Secondary | ICD-10-CM | POA: Diagnosis not present

## 2024-02-01 DIAGNOSIS — I10 Essential (primary) hypertension: Secondary | ICD-10-CM | POA: Diagnosis not present

## 2024-02-01 DIAGNOSIS — E1169 Type 2 diabetes mellitus with other specified complication: Secondary | ICD-10-CM | POA: Diagnosis not present

## 2024-02-01 MED ORDER — MOUNJARO 5 MG/0.5ML ~~LOC~~ SOAJ
SUBCUTANEOUS | 0 refills | Status: DC
  Filled 2024-02-01: qty 2, 28d supply, fill #0

## 2024-02-01 MED ORDER — MELOXICAM 15 MG PO TABS
15.0000 mg | ORAL_TABLET | Freq: Every day | ORAL | 0 refills | Status: AC
  Filled 2024-02-01: qty 30, 30d supply, fill #0

## 2024-02-02 ENCOUNTER — Other Ambulatory Visit (HOSPITAL_COMMUNITY): Payer: Self-pay

## 2024-02-07 ENCOUNTER — Other Ambulatory Visit (HOSPITAL_COMMUNITY): Payer: Self-pay

## 2024-02-08 ENCOUNTER — Other Ambulatory Visit (HOSPITAL_COMMUNITY): Payer: Self-pay

## 2024-02-10 ENCOUNTER — Other Ambulatory Visit (HOSPITAL_COMMUNITY): Payer: Self-pay

## 2024-02-16 ENCOUNTER — Other Ambulatory Visit (HOSPITAL_COMMUNITY): Payer: Self-pay

## 2024-02-16 MED ORDER — CLOTRIMAZOLE-BETAMETHASONE 1-0.05 % EX CREA
TOPICAL_CREAM | CUTANEOUS | 0 refills | Status: DC
  Filled 2024-02-16: qty 15, 14d supply, fill #0

## 2024-03-06 ENCOUNTER — Other Ambulatory Visit (HOSPITAL_COMMUNITY): Payer: Self-pay

## 2024-03-06 DIAGNOSIS — Z6839 Body mass index (BMI) 39.0-39.9, adult: Secondary | ICD-10-CM | POA: Diagnosis not present

## 2024-03-06 DIAGNOSIS — Z1211 Encounter for screening for malignant neoplasm of colon: Secondary | ICD-10-CM | POA: Diagnosis not present

## 2024-03-06 DIAGNOSIS — Z0000 Encounter for general adult medical examination without abnormal findings: Secondary | ICD-10-CM | POA: Diagnosis not present

## 2024-03-06 DIAGNOSIS — E1169 Type 2 diabetes mellitus with other specified complication: Secondary | ICD-10-CM | POA: Diagnosis not present

## 2024-03-06 MED ORDER — MOUNJARO 5 MG/0.5ML ~~LOC~~ SOAJ
5.0000 mg | SUBCUTANEOUS | 0 refills | Status: DC
  Filled 2024-03-06: qty 2, 28d supply, fill #0

## 2024-03-06 MED ORDER — LISINOPRIL 20 MG PO TABS
20.0000 mg | ORAL_TABLET | Freq: Every day | ORAL | 0 refills | Status: DC
  Filled 2024-03-06 – 2024-04-10 (×2): qty 90, 90d supply, fill #0

## 2024-03-09 ENCOUNTER — Other Ambulatory Visit (HOSPITAL_COMMUNITY): Payer: Self-pay

## 2024-03-29 ENCOUNTER — Other Ambulatory Visit (HOSPITAL_COMMUNITY): Payer: Self-pay

## 2024-04-03 ENCOUNTER — Other Ambulatory Visit (HOSPITAL_COMMUNITY): Payer: Self-pay

## 2024-04-04 ENCOUNTER — Other Ambulatory Visit (HOSPITAL_COMMUNITY): Payer: Self-pay

## 2024-04-04 MED ORDER — MOUNJARO 5 MG/0.5ML ~~LOC~~ SOAJ
5.0000 mg | SUBCUTANEOUS | 0 refills | Status: AC
  Filled 2024-04-04: qty 6, 84d supply, fill #0

## 2024-04-07 ENCOUNTER — Other Ambulatory Visit (HOSPITAL_COMMUNITY): Payer: Self-pay

## 2024-04-10 ENCOUNTER — Other Ambulatory Visit (HOSPITAL_COMMUNITY): Payer: Self-pay

## 2024-04-22 ENCOUNTER — Other Ambulatory Visit (HOSPITAL_COMMUNITY): Payer: Self-pay

## 2024-04-23 ENCOUNTER — Other Ambulatory Visit (HOSPITAL_COMMUNITY): Payer: Self-pay

## 2024-04-23 MED ORDER — CLOTRIMAZOLE-BETAMETHASONE 1-0.05 % EX CREA
1.0000 | TOPICAL_CREAM | Freq: Two times a day (BID) | CUTANEOUS | 0 refills | Status: AC
  Filled 2024-04-23: qty 15, 8d supply, fill #0

## 2024-05-03 ENCOUNTER — Other Ambulatory Visit (HOSPITAL_COMMUNITY): Payer: Self-pay

## 2024-05-07 ENCOUNTER — Other Ambulatory Visit (HOSPITAL_COMMUNITY): Payer: Self-pay

## 2024-05-07 DIAGNOSIS — Z6838 Body mass index (BMI) 38.0-38.9, adult: Secondary | ICD-10-CM | POA: Diagnosis not present

## 2024-05-07 DIAGNOSIS — N390 Urinary tract infection, site not specified: Secondary | ICD-10-CM | POA: Diagnosis not present

## 2024-05-07 MED ORDER — NITROFURANTOIN MONOHYD MACRO 100 MG PO CAPS
100.0000 mg | ORAL_CAPSULE | Freq: Two times a day (BID) | ORAL | 0 refills | Status: AC
  Filled 2024-05-07: qty 10, 5d supply, fill #0

## 2024-05-09 DIAGNOSIS — H2512 Age-related nuclear cataract, left eye: Secondary | ICD-10-CM | POA: Diagnosis not present

## 2024-05-09 DIAGNOSIS — H5052 Exophoria: Secondary | ICD-10-CM | POA: Diagnosis not present

## 2024-05-09 DIAGNOSIS — Z961 Presence of intraocular lens: Secondary | ICD-10-CM | POA: Diagnosis not present

## 2024-05-11 ENCOUNTER — Other Ambulatory Visit (HOSPITAL_COMMUNITY): Payer: Self-pay

## 2024-05-11 MED ORDER — CEPHALEXIN 500 MG PO CAPS
500.0000 mg | ORAL_CAPSULE | Freq: Two times a day (BID) | ORAL | 0 refills | Status: AC
  Filled 2024-05-11: qty 10, 5d supply, fill #0

## 2024-05-23 ENCOUNTER — Other Ambulatory Visit (HOSPITAL_COMMUNITY): Payer: Self-pay

## 2024-05-23 DIAGNOSIS — M25511 Pain in right shoulder: Secondary | ICD-10-CM | POA: Diagnosis not present

## 2024-05-23 MED ORDER — MELOXICAM 15 MG PO TABS
15.0000 mg | ORAL_TABLET | Freq: Every day | ORAL | 1 refills | Status: AC | PRN
  Filled 2024-05-23: qty 30, 30d supply, fill #0

## 2024-06-02 ENCOUNTER — Other Ambulatory Visit (HOSPITAL_COMMUNITY): Payer: Self-pay

## 2024-06-04 ENCOUNTER — Other Ambulatory Visit (HOSPITAL_COMMUNITY): Payer: Self-pay

## 2024-06-05 ENCOUNTER — Other Ambulatory Visit (HOSPITAL_COMMUNITY): Payer: Self-pay

## 2024-06-05 MED ORDER — CARVEDILOL 6.25 MG PO TABS
6.2500 mg | ORAL_TABLET | Freq: Two times a day (BID) | ORAL | 0 refills | Status: DC
  Filled 2024-06-05: qty 180, 90d supply, fill #0

## 2024-06-05 MED ORDER — PRAVASTATIN SODIUM 40 MG PO TABS
40.0000 mg | ORAL_TABLET | Freq: Every day | ORAL | 0 refills | Status: DC
  Filled 2024-06-05: qty 90, 90d supply, fill #0

## 2024-06-20 ENCOUNTER — Other Ambulatory Visit: Payer: Commercial Managed Care - PPO

## 2024-06-20 ENCOUNTER — Ambulatory Visit (HOSPITAL_BASED_OUTPATIENT_CLINIC_OR_DEPARTMENT_OTHER)
Admission: RE | Admit: 2024-06-20 | Discharge: 2024-06-20 | Disposition: A | Discharge status: Home / Self Care | Source: Ambulatory Visit | Attending: Family Medicine | Admitting: Family Medicine

## 2024-06-20 ENCOUNTER — Other Ambulatory Visit (HOSPITAL_COMMUNITY): Payer: Self-pay

## 2024-06-20 DIAGNOSIS — M8580 Other specified disorders of bone density and structure, unspecified site: Secondary | ICD-10-CM | POA: Diagnosis not present

## 2024-06-20 DIAGNOSIS — Z780 Asymptomatic menopausal state: Secondary | ICD-10-CM | POA: Diagnosis not present

## 2024-06-20 DIAGNOSIS — M8589 Other specified disorders of bone density and structure, multiple sites: Secondary | ICD-10-CM | POA: Diagnosis not present

## 2024-06-20 MED ORDER — MOUNJARO 7.5 MG/0.5ML ~~LOC~~ SOAJ
7.5000 mg | SUBCUTANEOUS | 0 refills | Status: DC
  Filled 2024-06-20 – 2024-06-28 (×3): qty 2, 28d supply, fill #0

## 2024-06-22 ENCOUNTER — Other Ambulatory Visit (HOSPITAL_COMMUNITY): Payer: Self-pay

## 2024-06-27 ENCOUNTER — Other Ambulatory Visit (HOSPITAL_COMMUNITY): Payer: Self-pay

## 2024-06-28 ENCOUNTER — Other Ambulatory Visit (HOSPITAL_COMMUNITY): Payer: Self-pay

## 2024-06-29 ENCOUNTER — Other Ambulatory Visit (HOSPITAL_COMMUNITY): Payer: Self-pay

## 2024-07-02 ENCOUNTER — Other Ambulatory Visit (HOSPITAL_COMMUNITY): Payer: Self-pay

## 2024-07-02 MED ORDER — METFORMIN HCL ER 500 MG PO TB24
1000.0000 mg | ORAL_TABLET | Freq: Two times a day (BID) | ORAL | 0 refills | Status: DC
  Filled 2024-07-02 (×2): qty 360, 90d supply, fill #0

## 2024-07-11 ENCOUNTER — Other Ambulatory Visit (HOSPITAL_COMMUNITY): Payer: Self-pay

## 2024-07-11 MED ORDER — LISINOPRIL 20 MG PO TABS
20.0000 mg | ORAL_TABLET | Freq: Every day | ORAL | 0 refills | Status: DC
  Filled 2024-07-11: qty 90, 90d supply, fill #0

## 2024-07-18 ENCOUNTER — Other Ambulatory Visit (HOSPITAL_COMMUNITY): Payer: Self-pay

## 2024-07-18 DIAGNOSIS — N390 Urinary tract infection, site not specified: Secondary | ICD-10-CM | POA: Diagnosis not present

## 2024-07-18 DIAGNOSIS — Z6837 Body mass index (BMI) 37.0-37.9, adult: Secondary | ICD-10-CM | POA: Diagnosis not present

## 2024-07-18 DIAGNOSIS — R439 Unspecified disturbances of smell and taste: Secondary | ICD-10-CM | POA: Diagnosis not present

## 2024-07-18 MED ORDER — AMOXICILLIN-POT CLAVULANATE 875-125 MG PO TABS
1.0000 | ORAL_TABLET | Freq: Two times a day (BID) | ORAL | 0 refills | Status: AC
  Filled 2024-07-18: qty 20, 10d supply, fill #0

## 2024-07-18 MED ORDER — MOUNJARO 7.5 MG/0.5ML ~~LOC~~ SOAJ
7.5000 mg | SUBCUTANEOUS | 0 refills | Status: DC
  Filled 2024-07-18 – 2024-07-19 (×3): qty 2, 28d supply, fill #0

## 2024-07-19 ENCOUNTER — Other Ambulatory Visit (HOSPITAL_COMMUNITY): Payer: Self-pay

## 2024-07-19 MED ORDER — MOUNJARO 7.5 MG/0.5ML ~~LOC~~ SOAJ
7.5000 mg | SUBCUTANEOUS | 0 refills | Status: AC
  Filled 2024-07-19: qty 6, 84d supply, fill #0

## 2024-07-20 ENCOUNTER — Other Ambulatory Visit (HOSPITAL_COMMUNITY): Payer: Self-pay

## 2024-07-21 ENCOUNTER — Other Ambulatory Visit (HOSPITAL_COMMUNITY): Payer: Self-pay

## 2024-07-23 ENCOUNTER — Other Ambulatory Visit (HOSPITAL_COMMUNITY): Payer: Self-pay

## 2024-07-30 ENCOUNTER — Ambulatory Visit: Admitting: Plastic Surgery

## 2024-07-30 DIAGNOSIS — M542 Cervicalgia: Secondary | ICD-10-CM

## 2024-07-30 DIAGNOSIS — M549 Dorsalgia, unspecified: Secondary | ICD-10-CM | POA: Diagnosis not present

## 2024-07-30 DIAGNOSIS — M793 Panniculitis, unspecified: Secondary | ICD-10-CM | POA: Diagnosis not present

## 2024-07-30 DIAGNOSIS — R21 Rash and other nonspecific skin eruption: Secondary | ICD-10-CM

## 2024-07-30 NOTE — Progress Notes (Signed)
   Subjective:    Patient ID: Natalie Suarez, female    DOB: 1965/09/24, 58 y.o.   MRN: [id number]  The patient is a 58 year old female here for evaluation of her abdomen.  The patient is 5 feet 2 inches tall and weighs 191 pounds.  She has lost a significant amount of weight since 1 year ago.  She is being very diligent about improving her dietary intake and increasing her activity.  She complains of her pannus with skin irritation and rashes and skin breakdown.  She has severe back pain and neck pain.  She had a hysterectomy, C-section, tubal ligation and a breast reduction in the past.  She healed without difficulty at the time.  She is feeling like she is ready to move forward with the panniculectomy.  She feels like she could be more active if she did not have that extra weight and pannus that hangs over.      Review of Systems  Constitutional:  Positive for activity change. Negative for appetite change.  Eyes: Negative.   Respiratory: Negative.    Cardiovascular: Negative.   Gastrointestinal: Negative.   Endocrine: Negative.   Genitourinary: Negative.   Musculoskeletal:  Positive for back pain and neck pain.  Skin:  Positive for rash.       Objective:   Physical Exam Vitals reviewed.  Constitutional:      Appearance: Normal appearance.  HENT:     Head: Atraumatic.  Cardiovascular:     Rate and Rhythm: Normal rate.     Pulses: Normal pulses.  Pulmonary:     Effort: Pulmonary effort is normal.  Abdominal:     General: There is no distension.     Palpations: Abdomen is soft.     Tenderness: There is no abdominal tenderness.     Hernia: No hernia is present.  Skin:    General: Skin is warm.     Capillary Refill: Capillary refill takes less than 2 seconds.     Coloration: Skin is not jaundiced.     Findings: No bruising.  Neurological:     Mental Status: She is alert and oriented to person, place, and time.  Psychiatric:        Mood and Affect: Mood normal.         Behavior: Behavior normal.        Thought Content: Thought content normal.        Judgment: Judgment normal.           Assessment & Plan:     ICD-10-CM   1. Morbid obesity (HCC)  E66.01 Amb Ref to Medical Weight Management    2. Panniculitis  M79.3 Amb Ref to Medical Weight Management      The patient is a candidate for panniculectomy.  We will also give her a consult to the [facility].  Plan for panniculectomy with liposuction.

## 2024-08-01 ENCOUNTER — Other Ambulatory Visit (HOSPITAL_COMMUNITY): Payer: Self-pay

## 2024-08-01 DIAGNOSIS — K219 Gastro-esophageal reflux disease without esophagitis: Secondary | ICD-10-CM | POA: Diagnosis not present

## 2024-08-01 DIAGNOSIS — N898 Other specified noninflammatory disorders of vagina: Secondary | ICD-10-CM | POA: Diagnosis not present

## 2024-08-01 DIAGNOSIS — R1319 Other dysphagia: Secondary | ICD-10-CM | POA: Diagnosis not present

## 2024-08-01 DIAGNOSIS — R439 Unspecified disturbances of smell and taste: Secondary | ICD-10-CM | POA: Diagnosis not present

## 2024-08-01 DIAGNOSIS — Z6835 Body mass index (BMI) 35.0-35.9, adult: Secondary | ICD-10-CM | POA: Diagnosis not present

## 2024-08-01 MED ORDER — FLUCONAZOLE 150 MG PO TABS
150.0000 mg | ORAL_TABLET | Freq: Every day | ORAL | 1 refills | Status: AC
  Filled 2024-08-01: qty 1, 1d supply, fill #0
  Filled 2024-08-03 – 2024-08-14 (×2): qty 1, 1d supply, fill #1

## 2024-08-02 ENCOUNTER — Encounter (INDEPENDENT_AMBULATORY_CARE_PROVIDER_SITE_OTHER): Payer: Self-pay

## 2024-08-02 ENCOUNTER — Other Ambulatory Visit: Payer: Self-pay | Admitting: Family Medicine

## 2024-08-02 DIAGNOSIS — R439 Unspecified disturbances of smell and taste: Secondary | ICD-10-CM

## 2024-08-13 ENCOUNTER — Other Ambulatory Visit (HOSPITAL_COMMUNITY): Payer: Self-pay

## 2024-08-17 ENCOUNTER — Other Ambulatory Visit (HOSPITAL_COMMUNITY): Payer: Self-pay

## 2024-08-24 ENCOUNTER — Other Ambulatory Visit (HOSPITAL_COMMUNITY): Payer: Self-pay

## 2024-08-27 ENCOUNTER — Other Ambulatory Visit (HOSPITAL_COMMUNITY): Payer: Self-pay

## 2024-08-27 DIAGNOSIS — Z6835 Body mass index (BMI) 35.0-35.9, adult: Secondary | ICD-10-CM | POA: Diagnosis not present

## 2024-08-27 DIAGNOSIS — N390 Urinary tract infection, site not specified: Secondary | ICD-10-CM | POA: Diagnosis not present

## 2024-08-27 DIAGNOSIS — R350 Frequency of micturition: Secondary | ICD-10-CM | POA: Diagnosis not present

## 2024-08-27 MED ORDER — ESTRADIOL 0.01 % VA CREA
TOPICAL_CREAM | VAGINAL | 2 refills | Status: AC
  Filled 2024-08-27: qty 42.5, 90d supply, fill #0
  Filled 2024-09-07: qty 42.5, 84d supply, fill #0
  Filled 2024-09-13 – 2024-09-20 (×2): qty 42.5, 90d supply, fill #0

## 2024-08-31 ENCOUNTER — Other Ambulatory Visit (HOSPITAL_COMMUNITY): Payer: Self-pay

## 2024-08-31 DIAGNOSIS — R438 Other disturbances of smell and taste: Secondary | ICD-10-CM | POA: Diagnosis not present

## 2024-08-31 DIAGNOSIS — R1310 Dysphagia, unspecified: Secondary | ICD-10-CM | POA: Diagnosis not present

## 2024-09-03 ENCOUNTER — Other Ambulatory Visit (HOSPITAL_COMMUNITY): Payer: Self-pay

## 2024-09-03 MED ORDER — PRAVASTATIN SODIUM 40 MG PO TABS
40.0000 mg | ORAL_TABLET | Freq: Every day | ORAL | 0 refills | Status: AC
  Filled 2024-09-03 – 2024-09-13 (×2): qty 90, 90d supply, fill #0

## 2024-09-03 MED ORDER — CARVEDILOL 6.25 MG PO TABS
6.2500 mg | ORAL_TABLET | Freq: Two times a day (BID) | ORAL | 0 refills | Status: AC
  Filled 2024-09-03 – 2024-09-13 (×2): qty 180, 90d supply, fill #0

## 2024-09-07 ENCOUNTER — Other Ambulatory Visit (HOSPITAL_COMMUNITY): Payer: Self-pay

## 2024-09-12 ENCOUNTER — Other Ambulatory Visit (HOSPITAL_COMMUNITY): Payer: Self-pay

## 2024-09-13 ENCOUNTER — Other Ambulatory Visit (HOSPITAL_COMMUNITY): Payer: Self-pay

## 2024-09-14 ENCOUNTER — Other Ambulatory Visit (HOSPITAL_COMMUNITY): Payer: Self-pay

## 2024-09-18 ENCOUNTER — Other Ambulatory Visit (HOSPITAL_COMMUNITY): Payer: Self-pay

## 2024-09-18 MED ORDER — ALBUTEROL SULFATE HFA 108 (90 BASE) MCG/ACT IN AERS
1.0000 | INHALATION_SPRAY | RESPIRATORY_TRACT | 1 refills | Status: AC | PRN
  Filled 2024-09-18: qty 6.7, 30d supply, fill #0

## 2024-09-19 ENCOUNTER — Other Ambulatory Visit (HOSPITAL_COMMUNITY): Payer: Self-pay

## 2024-09-20 ENCOUNTER — Other Ambulatory Visit (HOSPITAL_COMMUNITY): Payer: Self-pay

## 2024-09-21 ENCOUNTER — Other Ambulatory Visit (HOSPITAL_COMMUNITY): Payer: Self-pay

## 2024-10-01 ENCOUNTER — Encounter (HOSPITAL_COMMUNITY): Payer: Self-pay | Admitting: Pharmacist

## 2024-10-01 ENCOUNTER — Other Ambulatory Visit (HOSPITAL_COMMUNITY): Payer: Self-pay

## 2024-10-01 MED ORDER — METFORMIN HCL ER 500 MG PO TB24
1000.0000 mg | ORAL_TABLET | Freq: Two times a day (BID) | ORAL | 0 refills | Status: AC
  Filled 2024-10-01 (×3): qty 360, 90d supply, fill #0

## 2024-10-02 ENCOUNTER — Other Ambulatory Visit: Payer: Self-pay

## 2024-10-02 ENCOUNTER — Other Ambulatory Visit (HOSPITAL_COMMUNITY): Payer: Self-pay

## 2024-10-05 ENCOUNTER — Other Ambulatory Visit (HOSPITAL_COMMUNITY): Payer: Self-pay

## 2024-10-05 MED ORDER — MOUNJARO 10 MG/0.5ML ~~LOC~~ SOAJ
SUBCUTANEOUS | 0 refills | Status: AC
  Filled 2024-10-05: qty 2, 28d supply, fill #0

## 2024-10-06 ENCOUNTER — Other Ambulatory Visit (HOSPITAL_COMMUNITY): Payer: Self-pay

## 2024-10-10 ENCOUNTER — Other Ambulatory Visit: Payer: Self-pay

## 2024-10-10 ENCOUNTER — Other Ambulatory Visit (HOSPITAL_COMMUNITY): Payer: Self-pay

## 2024-10-10 MED ORDER — ONDANSETRON HCL 4 MG PO TABS
4.0000 mg | ORAL_TABLET | Freq: Four times a day (QID) | ORAL | 0 refills | Status: AC | PRN
  Filled 2024-10-10: qty 30, 8d supply, fill #0

## 2024-10-20 ENCOUNTER — Other Ambulatory Visit (HOSPITAL_COMMUNITY): Payer: Self-pay

## 2024-10-22 ENCOUNTER — Other Ambulatory Visit (HOSPITAL_COMMUNITY): Payer: Self-pay

## 2024-10-22 MED ORDER — LISINOPRIL 20 MG PO TABS
20.0000 mg | ORAL_TABLET | Freq: Every day | ORAL | 1 refills | Status: AC
  Filled 2024-10-22: qty 90, 90d supply, fill #0
